# Patient Record
Sex: Female | Born: 1955 | Race: White | Hispanic: No | Marital: Married | Smoking: Never smoker
Health system: Southern US, Community
[De-identification: ages and names within clinical notes are randomized; demographics above are authoritative.]

## PROBLEM LIST (undated history)

## (undated) DIAGNOSIS — E785 Hyperlipidemia, unspecified: Secondary | ICD-10-CM

## (undated) DIAGNOSIS — Z8489 Family history of other specified conditions: Secondary | ICD-10-CM

## (undated) HISTORY — DX: Hyperlipidemia, unspecified: E78.5

## (undated) HISTORY — PX: TUBAL LIGATION: SHX77

## (undated) HISTORY — PX: COLONOSCOPY W/ BIOPSIES: SHX1374

---

## 1993-09-19 HISTORY — PX: ROTATOR CUFF REPAIR: SHX139

## 2001-01-30 ENCOUNTER — Other Ambulatory Visit: Admission: RE | Admit: 2001-01-30 | Discharge: 2001-01-30 | Payer: Self-pay | Admitting: Family Medicine

## 2003-01-20 ENCOUNTER — Other Ambulatory Visit: Admission: RE | Admit: 2003-01-20 | Discharge: 2003-01-20 | Payer: Self-pay | Admitting: Obstetrics & Gynecology

## 2011-09-23 ENCOUNTER — Ambulatory Visit (INDEPENDENT_AMBULATORY_CARE_PROVIDER_SITE_OTHER): Payer: 59 | Admitting: Sports Medicine

## 2011-09-23 VITALS — BP 124/80 | Ht 64.0 in | Wt 205.0 lb

## 2011-09-23 DIAGNOSIS — R269 Unspecified abnormalities of gait and mobility: Secondary | ICD-10-CM

## 2011-09-23 DIAGNOSIS — M7660 Achilles tendinitis, unspecified leg: Secondary | ICD-10-CM | POA: Insufficient documentation

## 2011-09-23 MED ORDER — NITROGLYCERIN 0.2 MG/HR TD PT24: MEDICATED_PATCH | TRANSDERMAL | Status: DC

## 2011-09-23 NOTE — Assessment & Plan Note (Signed)
Will treat with NTG patch, heel lift, and HEP.  We gave her sports insoles with heel lifts and an additional pair of heel lifts to wear in shoes that will not accommodate the sports insoles.

## 2011-09-23 NOTE — Progress Notes (Signed)
  Subjective:    Patient ID: Sabrina Yang, female    DOB: 02/08/1956, 56 y.o.   MRN: [id number]  HPI 56 y/o female is here complaining of right foot pain x6 months.  No trauma.  She was part of a walking group this spring and had no pain at all.  She feels that the pain started when she decreased her activity.  She has pain on the lateral anterior ankle and the achilles tendon.  The achilles tendon pain is worse with uphill walking.  The ankle pain is worse in the am and improved over the day.  She never gets swelling of the ankle   Review of Systems     Objective:   Physical Exam  Ankle: No visible erythema or swelling. Range of motion is full in all directions. Strength is 5/5 in all directions. Stable lateral and medial ligaments Tender to palpation over the sinus tarsi Tender to palpation over the distal achilles tendon Palpable haglund's deformity  Gait: She walks with feet everted, left greater than right.  There is bilateral pronation, left greater than right.  Ultrasound: The right achilles tendon measures 0.77 cm and has a small amount of fluid. There is no doppler activity.  There is a deformity of the calcaneus c/w a haglund's.  The left achilles tendon measures 0.5cm.  There is a small amount of fluid noted in the sinus tarsi.       Assessment & Plan:

## 2011-09-23 NOTE — Assessment & Plan Note (Signed)
Heel lifts in her shoes should decrease the achilles pain and help to her to walk more neutral.  The ankle pain should resolve with correction of her gait.

## 2011-09-23 NOTE — Patient Instructions (Signed)
1.  Do your exercises once a day.  2. Wear your heel in all of your shoes.  3. Wear your patches everyday.  If you get a headache you can use tylenol or ibuprofen. If that doesn't work then call us, [phone].  4. Follow with [place] in 6 weeks.

## 2011-11-07 ENCOUNTER — Ambulatory Visit (INDEPENDENT_AMBULATORY_CARE_PROVIDER_SITE_OTHER): Payer: 59 | Admitting: Sports Medicine

## 2011-11-07 ENCOUNTER — Encounter: Payer: Self-pay | Admitting: Sports Medicine

## 2011-11-07 VITALS — BP 112/74 | HR 60

## 2011-11-07 DIAGNOSIS — M7660 Achilles tendinitis, unspecified leg: Secondary | ICD-10-CM

## 2011-11-07 NOTE — Patient Instructions (Signed)
Your achilles tendonitis has improved, but your achilles is still thicker than normal  Please continue using the 1/4 nitroglycerin patch daily, and continue doing your home exercises for this  Please follow up in 6 weeks.  Thank you for seeing [place] today!

## 2011-11-07 NOTE — Assessment & Plan Note (Signed)
This is much improved  Based on the thickness of her  Achilles tendon, I think we should continue this treatment for at least another 6 weeks  Keep up exercises, nitroglycerin and sports insoles  Recheck 6 weeks

## 2011-11-07 NOTE — Progress Notes (Signed)
  Subjective:    Patient ID: Sabrina Yang, female    DOB: 01-Oct-1955, 56 y.o.   MRN: [id number]  HPI  Pt presents to clinic for of RT achilles tendonitis which she says is 50% improved. Still has pain when she gets up to walk first thing in the mornings, and with increased walking. She has been using NTG patch daily, HEP 4 times per week, and using sports insoles with heel lifts.  She is able to do all of her exercise classes including two zumba classes per week     Review of Systems     Objective:   Physical Exam No acute distress  RT AT: Feels less thick  No pain on squeeze test No ttp calf No pain with plantar or dorsi flexion   MSK ultrasound Right Achilles tendon continues to be thick and 0.77 cm about 2 cm above the calcaneus There is a chronic Haglund type bone spur at the distal insertion but this is nontender The swelling and edema noted on last ultrasound is markedly reduced and seems to be in only about 10% of the tendon Doppler activity is normal      Assessment & Plan:

## 2011-12-13 ENCOUNTER — Ambulatory Visit (INDEPENDENT_AMBULATORY_CARE_PROVIDER_SITE_OTHER): Payer: 59 | Admitting: Sports Medicine

## 2011-12-13 VITALS — BP 118/91

## 2011-12-13 DIAGNOSIS — M7660 Achilles tendinitis, unspecified leg: Secondary | ICD-10-CM

## 2011-12-13 DIAGNOSIS — R269 Unspecified abnormalities of gait and mobility: Secondary | ICD-10-CM

## 2011-12-13 NOTE — Progress Notes (Deleted)
  Subjective:    Patient ID: Sabrina Yang, female    DOB: 1956/01/04, 56 y.o.   MRN: [id number]  HPI  Pt presents to clinic for f/u of peroneal tendinitis which he   Review of Systems     Objective:   Physical Exam        Assessment & Plan:

## 2011-12-13 NOTE — Assessment & Plan Note (Addendum)
RT ankle pain continues to improve with NTG patch, shoe inserts, and heel lift exercises. Continue to use NTG patch for 3 more months. Continue to perform heel lifts and exercises 3 days/week. Return to clinic in 3 months to track progress.  Repeat US on that visit

## 2011-12-13 NOTE — Patient Instructions (Signed)
Increase heel lift exercises to 15 lifts x 3 three days a week. Place inserts in all shoes. Continue with NTG patches for another 3 months. Schedule follow up appointment in 3 months or as needed.

## 2011-12-13 NOTE — Assessment & Plan Note (Signed)
Now walking without limp

## 2011-12-13 NOTE — Progress Notes (Signed)
  Subjective:    Patient ID: Sabrina Yang, female    DOB: 1956/09/16, 56 y.o.   MRN: [id number]  HPI  Patient returns to clinic for follow up RT achilles tendonitis.  States that her ankle pain has improved about 50% since last visit.  R heel pain occurs mostly in the morning and at work when she stands after sitting for a long time.  Pain is mild, described as "soreness."  Patient is able to walk 1 mile/day, take Zumba and body pump classes without any difficulty.  Patient continues to use shoe inserts, Nitro patch, and performs heel lifts and exercises 3 times/week.  Denies any pain in knees or hips.  Now 3 mos into TX with NTG and overall 80% improved  Review of Systems  Per HPI    Objective:   Physical Exam NAD  RT AT is TTP only at insertion Thick in distal 2 cms of tendon  Ankle, right. No visible erythema or swelling. Range of motion is full in all directions. Strength is 5/5 in all directions. Stable lateral and medial ligaments; squeeze test and kleiger test unremarkable; No pain at base of 5th MT; No tenderness over cuboid; No tenderness over N spot or navicular prominence No tenderness on posterior aspects of lateral and medial malleolus No sign of peroneal tendon subluxations; Negative tarsal tunnel tinel's Able to walk 4 steps.  [place] 11/2011: decreased swelling and thickness of RT achilles tendon; Doppler flow revealed good arterial flow and neo-vascularization; neovessels are new; central split of tendon is now resolved!     Assessment & Plan:

## 2012-03-12 ENCOUNTER — Ambulatory Visit: Payer: 59 | Admitting: Sports Medicine

## 2012-03-14 ENCOUNTER — Ambulatory Visit: Payer: 59 | Admitting: Sports Medicine

## 2012-04-04 ENCOUNTER — Ambulatory Visit (INDEPENDENT_AMBULATORY_CARE_PROVIDER_SITE_OTHER): Payer: 59 | Admitting: Sports Medicine

## 2012-04-04 VITALS — BP 121/77

## 2012-04-04 DIAGNOSIS — M7660 Achilles tendinitis, unspecified leg: Secondary | ICD-10-CM

## 2012-04-04 NOTE — Patient Instructions (Addendum)
It has been a pleasure to see you today. Continue using nitroglycerin patch daily for 3 more months and make a follow up appointment at that time.

## 2012-04-04 NOTE — Assessment & Plan Note (Signed)
While her sxs are minimal she is 90% improved by her report --- we still see abnormal doppler flow on scanning.  I think we should treat this with same protocol for another 3 mons.  Do exercises more regularly.  Reck with me in 3 mos with repeat US.

## 2012-04-04 NOTE — Progress Notes (Signed)
  Subjective:    Patient ID: Sabrina Yang, female    DOB: 05/25/1956, 56 y.o.   MRN: [id number]  HPI Pt that comes today for Achilles tendonitis follow up. She is feeling 90 % better, still some intermittent soreness in the posterior aspect of her right heel but nothing compared to what she had experienced in the past. She complaints now of frequent legs cramps only on her right calf at night time. No edema, erythema or any other sign of inflammation. Exercise has helped but now she is doing them inconsistently. She has been on NTG patch (1/4) for about 6 months now with not reported side effects.   Review of Systems Per HPI    Objective:   Physical Exam NAD NWG:NFAO edema around distal insertion area of Achilles tendon, mildly tender to palpation, no erythema. Normal ROM of R foot. Thompson squeeze test negative.   Ultrasound showed close to healed achilles tendon but persistence of neovascularization. The width is now 0.65.  Area of hypeoechoic change has resolved.    Assessment & Plan:

## 2013-11-04 ENCOUNTER — Emergency Department (HOSPITAL_COMMUNITY)
Admission: EM | Admit: 2013-11-04 | Discharge: 2013-11-04 | Disposition: A | Discharge status: Home / Self Care | Payer: 59 | Attending: Emergency Medicine | Admitting: Emergency Medicine

## 2013-11-04 ENCOUNTER — Encounter (HOSPITAL_COMMUNITY): Payer: Self-pay | Admitting: Emergency Medicine

## 2013-11-04 ENCOUNTER — Emergency Department (HOSPITAL_COMMUNITY): Payer: 59

## 2013-11-04 DIAGNOSIS — R911 Solitary pulmonary nodule: Secondary | ICD-10-CM

## 2013-11-04 DIAGNOSIS — Z79899 Other long term (current) drug therapy: Secondary | ICD-10-CM | POA: Insufficient documentation

## 2013-11-04 DIAGNOSIS — Z9889 Other specified postprocedural states: Secondary | ICD-10-CM | POA: Insufficient documentation

## 2013-11-04 DIAGNOSIS — R079 Chest pain, unspecified: Secondary | ICD-10-CM

## 2013-11-04 DIAGNOSIS — K8020 Calculus of gallbladder without cholecystitis without obstruction: Secondary | ICD-10-CM

## 2013-11-04 DIAGNOSIS — R0602 Shortness of breath: Secondary | ICD-10-CM | POA: Insufficient documentation

## 2013-11-04 DIAGNOSIS — Z7982 Long term (current) use of aspirin: Secondary | ICD-10-CM | POA: Insufficient documentation

## 2013-11-04 LAB — TYPE AND SCREEN
ABO/RH(D): A POS
ANTIBODY SCREEN: NEGATIVE

## 2013-11-04 LAB — CBC WITH DIFFERENTIAL/PLATELET
Basophils Absolute: 0.1 10*3/uL (ref 0.0–0.1)
Basophils Relative: 1 % (ref 0–1)
Eosinophils Absolute: 0.1 10*3/uL (ref 0.0–0.7)
Eosinophils Relative: 2 % (ref 0–5)
HEMATOCRIT: 41.1 % (ref 36.0–46.0)
HEMOGLOBIN: 13.6 g/dL (ref 12.0–15.0)
LYMPHS PCT: 17 % (ref 12–46)
Lymphs Abs: 1.5 10*3/uL (ref 0.7–4.0)
MCH: 28 pg (ref 26.0–34.0)
MCHC: 33.1 g/dL (ref 30.0–36.0)
MCV: 84.7 fL (ref 78.0–100.0)
MONO ABS: 0.5 10*3/uL (ref 0.1–1.0)
MONOS PCT: 6 % (ref 3–12)
NEUTROS PCT: 75 % (ref 43–77)
Neutro Abs: 6.7 10*3/uL (ref 1.7–7.7)
Platelets: 246 10*3/uL (ref 150–400)
RBC: 4.85 MIL/uL (ref 3.87–5.11)
RDW: 13.5 % (ref 11.5–15.5)
WBC: 8.8 10*3/uL (ref 4.0–10.5)

## 2013-11-04 LAB — COMPREHENSIVE METABOLIC PANEL
ALT: 23 U/L (ref 0–35)
AST: 26 U/L (ref 0–37)
Albumin: 3.8 g/dL (ref 3.5–5.2)
Alkaline Phosphatase: 64 U/L (ref 39–117)
BUN: 18 mg/dL (ref 6–23)
CALCIUM: 10.4 mg/dL (ref 8.4–10.5)
CO2: 27 mEq/L (ref 19–32)
CREATININE: 0.67 mg/dL (ref 0.50–1.10)
Chloride: 102 mEq/L (ref 96–112)
GFR calc Af Amer: >90 mL/min (ref >90)
GFR calc non Af Amer: >90 mL/min (ref >90)
GLUCOSE: 113 mg/dL — AB (ref 70–99)
Potassium: 4.5 mEq/L (ref 3.7–5.3)
SODIUM: 140 meq/L (ref 137–147)
TOTAL PROTEIN: 7.3 g/dL (ref 6.0–8.3)
Total Bilirubin: 0.3 mg/dL (ref 0.3–1.2)

## 2013-11-04 LAB — URINALYSIS, ROUTINE W REFLEX MICROSCOPIC
Bilirubin Urine: NEGATIVE
Glucose, UA: NEGATIVE mg/dL
HGB URINE DIPSTICK: NEGATIVE
Ketones, ur: NEGATIVE mg/dL
LEUKOCYTES UA: NEGATIVE
Nitrite: NEGATIVE
PH: 7.5 (ref 5.0–8.0)
PROTEIN: NEGATIVE mg/dL
Specific Gravity, Urine: 1.015 (ref 1.005–1.030)
Urobilinogen, UA: 0.2 mg/dL (ref 0.0–1.0)

## 2013-11-04 LAB — POCT I-STAT TROPONIN I
Troponin i, poc: 0 ng/mL (ref 0.00–0.08)
Troponin i, poc: 0 ng/mL (ref 0.00–0.08)

## 2013-11-04 LAB — URINE MICROSCOPIC-ADD ON

## 2013-11-04 LAB — LIPASE, BLOOD: Lipase: 34 U/L (ref 11–59)

## 2013-11-04 LAB — CG4 I-STAT (LACTIC ACID): LACTIC ACID, VENOUS: 1.5 mmol/L (ref 0.5–2.2)

## 2013-11-04 LAB — ABO/RH: ABO/RH(D): A POS

## 2013-11-04 MED ORDER — MORPHINE SULFATE 2 MG/ML IJ SOLN
2.0000 mg | Freq: Once | INTRAMUSCULAR | Status: AC
  Administered 2013-11-04: 2 mg via INTRAVENOUS
  Filled 2013-11-04: qty 1

## 2013-11-04 MED ORDER — ONDANSETRON HCL 4 MG/2ML IJ SOLN
4.0000 mg | Freq: Once | INTRAMUSCULAR | Status: AC
  Administered 2013-11-04: 4 mg via INTRAVENOUS
  Filled 2013-11-04: qty 2

## 2013-11-04 MED ORDER — IOHEXOL 300 MG/ML  SOLN
100.0000 mL | Freq: Once | INTRAMUSCULAR | Status: AC | PRN
  Administered 2013-11-04: 100 mL via INTRAVENOUS

## 2013-11-04 MED ORDER — ONDANSETRON HCL 8 MG PO TABS: 8.0000 mg | ORAL_TABLET | Freq: Three times a day (TID) | ORAL | Status: DC | PRN

## 2013-11-04 MED ORDER — IOHEXOL 300 MG/ML  SOLN
25.0000 mL | INTRAMUSCULAR | Status: AC
  Administered 2013-11-04: 25 mL via ORAL

## 2013-11-04 MED ORDER — OXYCODONE-ACETAMINOPHEN 5-325 MG PO TABS: 1.0000 | ORAL_TABLET | ORAL | Status: DC | PRN

## 2013-11-04 NOTE — ED Notes (Signed)
Pt in xray

## 2013-11-04 NOTE — ED Notes (Signed)
Pt presents from home with c/o of chest pain that is under her Right breast radiating to the right back.  Pt states she was woken up by the pain around 12:30am this morning and the pain has progressively gotten worse.  Pt took 1 Aleve and 3 Tums at home.  Pain is 8/10.  Pt reports a constant nausea from the pain.

## 2013-11-04 NOTE — ED Notes (Signed)
MD at bedside. 

## 2013-11-04 NOTE — ED Provider Notes (Signed)
CSN: [id number]     Arrival date & time 11/04/13  [id number] History   First MD Initiated Contact with Patient 11/04/13 [phone]     Chief Complaint  Patient presents with  . Chest Pain  . Back Pain      Patient is a 58 y.o. female presenting with back pain. The history is provided by the patient.  Back Pain Pain location: SCAPULAR REGION. Quality:  Aching Radiates to: right breast. Pain severity:  Moderate Pain is:  Same all the time Onset quality:  Sudden Duration:  7 hours Timing:  Constant Progression:  Unchanged Chronicity:  New Relieved by:  None tried Worsened by:  Movement Associated symptoms: abdominal pain and chest pain   Associated symptoms: no fever, no headaches and no weakness   Pt reports that while sleeping last night she had onset of upper back pain/scapular pain.  It then radiated to her right breast.  She reports mild SOB.  No fever/cough.  She reports RUQ pain as well.    She denies h/o CAD/DVT/PE No fam h/o CAD  She reports she had an uncomplicated colonoscopy last week  She denies pleuritic type CP She is not on hormone replacement She does not smoke  PMH - none fam hx - negative for CAD Past Surgical History  Procedure Laterality Date  . Colonoscopy w/ biopsies     History reviewed. No pertinent family history. History  Substance Use Topics  . Smoking status: Never Smoker   . Smokeless tobacco: Never Used  . Alcohol Use: No   OB History   Grav Para Term Preterm Abortions TAB SAB Ect Mult Living                 Review of Systems  Constitutional: Negative for fever.  Eyes: Negative for visual disturbance.  Respiratory: Positive for shortness of breath.   Cardiovascular: Positive for chest pain.  Gastrointestinal: Positive for nausea and abdominal pain. Negative for vomiting.  Musculoskeletal: Positive for back pain.  Neurological: Negative for syncope, weakness and headaches.  All other systems reviewed and are negative.      Allergies   Review of patient's allergies indicates no known allergies.  Home Medications   Current Outpatient Rx  Name  Route  Sig  Dispense  Refill  . aspirin 81 MG tablet   Oral   Take 160 mg by mouth daily.           . Multiple Vitamins-Iron (DAILY VITAMINS/IRON PO)   Oral   Take by mouth.           . nitroGLYCERIN (NITRODUR - DOSED IN MG/24 HR) 0.2 mg/hr      Apply 1/4 patch to your heel, change daily.   30 patch   11   . Omega-3 Fatty Acids (FISH OIL BURP-LESS PO)   Oral   Take by mouth.            BP 132/78  Pulse 73  Temp(Src) 98.7 F (37.1 C) (Oral)  Resp 15  Ht 5\' 5"  (1.651 m)  Wt 197 lb (89.359 kg)  BMI 32.78 kg/m2  SpO2 99% Physical Exam CONSTITUTIONAL: Well developed/well nourished HEAD: Normocephalic/atraumatic EYES: EOMI/PERRL ENMT: Mucous membranes moist NECK: supple no meningeal signs SPINE:entire spine nontender, no focal tenderness to upper back/scapular region No bruising/crepitance/stepoffs noted to spine CV: S1/S2 noted, no murmurs/rubs/gallops noted LUNGS: Lungs are clear to auscultation bilaterally, no apparent distress ABDOMEN: soft, mild RUQ tenderness noted, no rebound or guarding GU:no cva tenderness NEURO:  Pt is awake/alert, moves all extremitiesx4 EXTREMITIES: pulses normal, full ROM, no LE edema/tenderness noted SKIN: warm, color normal PSYCH: no abnormalities of mood noted  ED Course  Procedures (including critical care time) 8:14 AM Pt with scapular pain as well as abd pain since last night.  She is s/p colonscopy (2/13) Initial labs/imaging ordered Pt is noted to have low HEART score (less than 3) 10:49 AM Pt now developing worsening abd pain, now with RUQ and RLQ tenderness Will obtain CT imaging of abd/pelvis Pt denies active CP I doubt ACS at this time.  She denies pleuritic pain to suggest PE 12:52 PM CT shows cholelithiasis Labs reassuring She would like to go home as she is improved Advised outpatient surgical  followup We discussed strict return precautions  Labs Review Labs Reviewed  COMPREHENSIVE METABOLIC PANEL - Abnormal; Notable for the following:    Glucose, Bld 113 (*)    All other components within normal limits  URINALYSIS, ROUTINE W REFLEX MICROSCOPIC - Abnormal; Notable for the following:    APPearance TURBID (*)    All other components within normal limits  CBC WITH DIFFERENTIAL  LIPASE, BLOOD  URINE MICROSCOPIC-ADD ON  CG4 I-STAT (LACTIC ACID)  POCT I-STAT TROPONIN I  POCT I-STAT TROPONIN I  TYPE AND SCREEN  ABO/RH   Imaging Review Dg Abd Acute W/chest  11/04/2013   CLINICAL DATA:  Chest and lower abdominal pains  EXAM: ACUTE ABDOMEN SERIES (ABDOMEN 2 VIEW & CHEST 1 VIEW)  COMPARISON:  None.  FINDINGS: There is no evidence of dilated bowel loops or free intraperitoneal air. Moderate volume of formed stool in the rectum and colon. No radiopaque calculi or other significant radiographic abnormality is seen. Heart size and mediastinal contours are within normal limits. No focal airspace consolidation, pneumothorax, pulmonary edema or effusion. Nodular opacities are noted overlying both upper lobes. These are not definitively consistent with EKG leads. Minimal bronchitic changes.  IMPRESSION: 1. No acute cardiopulmonary process. 2. Nodular opacities overlying the upper lungs bilaterally may represent pulmonary nodules or cardiac leads. If the patient has cardiac leads on the upper chest, recommend removal of the adhesive portion and repeat chest x-ray. If there are no leads on the patient, then these are likely true pulmonary nodules and further evaluation with nonemergent chest CT would be warranted. 3. Negative for obstruction or free air. 4. A moderate volume of formed stool in the rectum and colon suggestive of constipation.   Electronically Signed   By: Malachy Moan M.D.   On: 11/04/2013 08:01    EKG Interpretation    Date/Time:  Monday November 04 2013 06:56:14  EST Ventricular Rate:  67 PR Interval:  166 QRS Duration: 93 QT Interval:  384 QTC Calculation: 405 R Axis:   50 Text Interpretation:  Sinus rhythm Non-specific ST-t changes No previous ECGs available Confirmed by Bebe Shaggy  MD, Akayla Brass [phone]) on 11/04/2013 7:07:56 AM            MDM   Final diagnoses:  Chest pain  Cholelithiasis  Pulmonary nodule    Nursing notes including past medical history and social history reviewed and considered in documentation xrays reviewed and considered Labs/vital reviewed and considered     Joya Gaskins, MD 11/04/13 1253

## 2013-11-04 NOTE — Discharge Instructions (Signed)
PLEASE FOLLOWUP WITH YOUR PRIMARY DOCTOR IN ONE MONTH TO RE-EVALUATE FOR PULMONARY NODULES IN YOUR LUNGS

## 2013-11-15 ENCOUNTER — Ambulatory Visit (INDEPENDENT_AMBULATORY_CARE_PROVIDER_SITE_OTHER): Payer: 59 | Admitting: General Surgery

## 2013-11-20 ENCOUNTER — Ambulatory Visit (INDEPENDENT_AMBULATORY_CARE_PROVIDER_SITE_OTHER): Payer: 59 | Admitting: General Surgery

## 2013-11-21 ENCOUNTER — Encounter (INDEPENDENT_AMBULATORY_CARE_PROVIDER_SITE_OTHER): Payer: Self-pay | Admitting: General Surgery

## 2013-11-21 ENCOUNTER — Ambulatory Visit (INDEPENDENT_AMBULATORY_CARE_PROVIDER_SITE_OTHER): Payer: 59 | Admitting: General Surgery

## 2013-11-21 VITALS — BP 120/70 | HR 72 | Temp 99.0°F | Resp 14 | Ht 65.5 in | Wt 198.6 lb

## 2013-11-21 DIAGNOSIS — K8020 Calculus of gallbladder without cholecystitis without obstruction: Secondary | ICD-10-CM

## 2013-11-21 NOTE — Progress Notes (Signed)
Patient ID: Sabrina Yang, female   DOB: 08-19-1956, 58 y.o.   MRN: [id number]  Chief Complaint  Patient presents with  . New Evaluation    eval gallbladder    HPI Sabrina Lovelyhyllis Mari Correia is a 58 y.o. female.  The patient is a 58 year old female who was referred secondary to right upper quadrant/epigastric pain. Patient was seen in the ED in on 11/04/2013 secondary to this pain. Patient underwent workup which revealed gallstones. Patient had her cardiac issues cleared at this visit.    Patient states that thePain in the subset was his initial episode. She states she had some nausea and vomiting associated with it.  HPI  Past Medical History  Diagnosis Date  . Hyperlipidemia     Past Surgical History  Procedure Laterality Date  . Colonoscopy w/ biopsies    . Rotator cuff repair  1995    right side  . Tubal ligation      Family History  Problem Relation Age of Onset  . Cancer Mother     breast  . Diabetes Mother   . Hypertension Mother   . Hypertension Father   . Cancer Father     prostate  . Cancer Sister     lung    Social History History  Substance Use Topics  . Smoking status: Never Smoker   . Smokeless tobacco: Never Used  . Alcohol Use: No    No Known Allergies  Current Outpatient Prescriptions  Medication Sig Dispense Refill  . Multiple Vitamins-Iron (DAILY VITAMINS/IRON PO) Take 1 tablet by mouth daily.       . Omega-3 Fatty Acids (FISH OIL BURP-LESS PO) Take 1 capsule by mouth daily.        No current facility-administered medications for this visit.    Review of Systems Review of Systems  Constitutional: Negative.   HENT: Negative.   Respiratory: Negative.   Cardiovascular: Negative.   Gastrointestinal: Positive for abdominal pain.  Neurological: Negative.   All other systems reviewed and are negative.    Blood pressure 120/70, pulse 72, temperature 99 F (37.2 C), temperature source Oral, resp. rate 14, height 5' 5.5" (1.664 m), weight  198 lb 9.6 oz (90.084 kg).  Physical Exam Physical Exam  Constitutional: She is oriented to person, place, and time. She appears well-developed and well-nourished.  HENT:  Head: Normocephalic and atraumatic.  Eyes: Conjunctivae and EOM are normal. Pupils are equal, round, and reactive to light.  Neck: Normal range of motion. Neck supple.  Cardiovascular: Normal rate, regular rhythm and normal heart sounds.   Pulmonary/Chest: Effort normal and breath sounds normal.  Abdominal: Soft. Bowel sounds are normal. She exhibits no distension and no mass. There is no tenderness. There is no rebound and no guarding.  Musculoskeletal: Normal range of motion.  Neurological: She is alert and oriented to person, place, and time.  Skin: Skin is warm and dry.  Psychiatric: She has a normal mood and affect.    Data Reviewed Laboratory studies within normal limits  CT scan revealed large 1.7 cm stone.  Assessment    58 year old female with systematic cholelithiasis     Plan    1. we'll proceed to the operating room for a laparoscopic cholecystectomy 2. All risks and benefits were discussed with the patient to generally include: infection, bleeding, possible need for post op ERCP, damage to the bile ducts, and bile leak. Alternatives were offered and described.  All questions were answered and the patient voiced understanding of the  procedure and wishes to proceed at this point with a laparoscopic cholecystectomy         Marigene Ehlers., [facility] 11/21/2013, 9:36 AM

## 2013-12-30 ENCOUNTER — Encounter (HOSPITAL_COMMUNITY): Payer: Self-pay

## 2013-12-31 ENCOUNTER — Encounter (HOSPITAL_COMMUNITY): Payer: Self-pay

## 2013-12-31 ENCOUNTER — Other Ambulatory Visit (INDEPENDENT_AMBULATORY_CARE_PROVIDER_SITE_OTHER): Payer: Self-pay | Admitting: General Surgery

## 2013-12-31 ENCOUNTER — Encounter (HOSPITAL_COMMUNITY)
Admission: RE | Admit: 2013-12-31 | Discharge: 2013-12-31 | Disposition: A | Discharge status: Home / Self Care | Payer: 59 | Source: Ambulatory Visit | Attending: General Surgery | Admitting: General Surgery

## 2013-12-31 HISTORY — DX: Family history of other specified conditions: Z84.89

## 2013-12-31 LAB — CBC
HEMATOCRIT: 40.3 % (ref 36.0–46.0)
Hemoglobin: 13.3 g/dL (ref 12.0–15.0)
MCH: 28.1 pg (ref 26.0–34.0)
MCHC: 33 g/dL (ref 30.0–36.0)
MCV: 85 fL (ref 78.0–100.0)
Platelets: 226 10*3/uL (ref 150–400)
RBC: 4.74 MIL/uL (ref 3.87–5.11)
RDW: 13.5 % (ref 11.5–15.5)
WBC: 6.9 10*3/uL (ref 4.0–10.5)

## 2013-12-31 LAB — BASIC METABOLIC PANEL
BUN: 22 mg/dL (ref 6–23)
CHLORIDE: 103 meq/L (ref 96–112)
CO2: 23 meq/L (ref 19–32)
Calcium: 9.8 mg/dL (ref 8.4–10.5)
Creatinine, Ser: 0.66 mg/dL (ref 0.50–1.10)
GFR calc non Af Amer: >90 mL/min (ref >90)
Glucose, Bld: 92 mg/dL (ref 70–99)
Potassium: 4.5 mEq/L (ref 3.7–5.3)
Sodium: 140 mEq/L (ref 137–147)

## 2013-12-31 MED ORDER — CEFAZOLIN SODIUM-DEXTROSE 2-3 GM-% IV SOLR
2.0000 g | INTRAVENOUS | Status: AC
  Administered 2014-01-01: 2 g via INTRAVENOUS
  Filled 2013-12-31: qty 50

## 2013-12-31 MED ORDER — CHLORHEXIDINE GLUCONATE 4 % EX LIQD
1.0000 "application " | Freq: Once | CUTANEOUS | Status: DC
  Filled 2013-12-31: qty 15

## 2013-12-31 NOTE — Progress Notes (Addendum)
No orders at PAT. Will call office around 9am. Patient had echo greater then 10 yrs ago.  Denies seeing a cardiologist, having any heart issues now.   PMH unremarkable. Have called and left message with surgical scheduling of [facility] for orders.

## 2013-12-31 NOTE — Pre-Procedure Instructions (Signed)
Sabrina Yang  12/31/2013   Your procedure is scheduled on:  Wednesday, April 15th   [facility] 3 at 6:30 AM.   Call this number if you have problems the morning of surgery: [phone]   Remember:   Do not eat food or drink liquids after midnight tonight.   Take these medicines the morning of surgery with A SIP OF WATER: Nothing   Do not wear jewelry, make-up or nail polish.  Do not wear lotions, powders, or perfumes. You may NOT wear deodorant.  Do not shave 48 hours prior to surgery.    Do not bring valuables to the hospital.  [facility] is not responsible for any belongings or valuables.               Contacts, dentures or bridgework may not be worn into surgery.  Leave suitcase in the car. After surgery it may be brought to your room.  For patients admitted to the hospital, discharge time is determined by your treatment team.               Patients discharged the day of surgery will not be allowed to drive home, and a responsible adult will need to stay with you for the first 24 hrs after surgery.   Name and phone number of your driver:    Special Instructions: "Preparing for Surgery" instruction sheet.   Please read over the following fact sheets that you were given: Pain Booklet, Coughing and Deep Breathing and Surgical Site Infection Prevention

## 2014-01-01 ENCOUNTER — Encounter (HOSPITAL_COMMUNITY)
Admission: RE | Disposition: A | Discharge status: Home / Self Care | Payer: Self-pay | Source: Ambulatory Visit | Attending: General Surgery

## 2014-01-01 ENCOUNTER — Encounter (HOSPITAL_COMMUNITY): Payer: 59 | Admitting: Certified Registered"

## 2014-01-01 ENCOUNTER — Encounter (HOSPITAL_COMMUNITY): Payer: Self-pay | Admitting: Certified Registered"

## 2014-01-01 ENCOUNTER — Ambulatory Visit (HOSPITAL_COMMUNITY): Payer: 59 | Admitting: Certified Registered"

## 2014-01-01 ENCOUNTER — Ambulatory Visit (HOSPITAL_COMMUNITY)
Admission: RE | Admit: 2014-01-01 | Discharge: 2014-01-01 | Disposition: A | Discharge status: Home / Self Care | Payer: 59 | Source: Ambulatory Visit | Attending: General Surgery | Admitting: General Surgery

## 2014-01-01 ENCOUNTER — Ambulatory Visit (HOSPITAL_COMMUNITY): Payer: 59

## 2014-01-01 DIAGNOSIS — K429 Umbilical hernia without obstruction or gangrene: Secondary | ICD-10-CM | POA: Insufficient documentation

## 2014-01-01 DIAGNOSIS — E785 Hyperlipidemia, unspecified: Secondary | ICD-10-CM | POA: Insufficient documentation

## 2014-01-01 DIAGNOSIS — K8010 Calculus of gallbladder with chronic cholecystitis without obstruction: Secondary | ICD-10-CM

## 2014-01-01 DIAGNOSIS — K8020 Calculus of gallbladder without cholecystitis without obstruction: Secondary | ICD-10-CM

## 2014-01-01 DIAGNOSIS — E669 Obesity, unspecified: Secondary | ICD-10-CM | POA: Insufficient documentation

## 2014-01-01 HISTORY — PX: CHOLECYSTECTOMY: SHX55

## 2014-01-01 LAB — DIFFERENTIAL
BASOS PCT: 1 % (ref 0–1)
Basophils Absolute: 0 10*3/uL (ref 0.0–0.1)
Eosinophils Absolute: 0.2 10*3/uL (ref 0.0–0.7)
Eosinophils Relative: 2 % (ref 0–5)
LYMPHS ABS: 2 10*3/uL (ref 0.7–4.0)
Lymphocytes Relative: 29 % (ref 12–46)
MONO ABS: 0.7 10*3/uL (ref 0.1–1.0)
MONOS PCT: 10 % (ref 3–12)
NEUTROS ABS: 3.9 10*3/uL (ref 1.7–7.7)
NEUTROS PCT: 58 % (ref 43–77)

## 2014-01-01 LAB — HEPATIC FUNCTION PANEL
ALT: 21 U/L (ref 0–35)
AST: 26 U/L (ref 0–37)
Albumin: 3.9 g/dL (ref 3.5–5.2)
Alkaline Phosphatase: 63 U/L (ref 39–117)
BILIRUBIN TOTAL: 0.3 mg/dL (ref 0.3–1.2)
Bilirubin, Direct: <0.2 mg/dL (ref 0.0–0.3)
Total Protein: 7.1 g/dL (ref 6.0–8.3)

## 2014-01-01 SURGERY — LAPAROSCOPIC CHOLECYSTECTOMY WITH INTRAOPERATIVE CHOLANGIOGRAM
Anesthesia: General | Site: Abdomen

## 2014-01-01 MED ORDER — SODIUM CHLORIDE 0.9 % IV SOLN
INTRAVENOUS | Status: DC | PRN
  Administered 2014-01-01: 09:00:00

## 2014-01-01 MED ORDER — HYDROCODONE-ACETAMINOPHEN 5-325 MG PO TABS: 1.0000 | ORAL_TABLET | Freq: Four times a day (QID) | ORAL | Status: DC | PRN

## 2014-01-01 MED ORDER — BUPIVACAINE-EPINEPHRINE 0.5% -1:200000 IJ SOLN
INTRAMUSCULAR | Status: DC | PRN
  Administered 2014-01-01: 11 mL

## 2014-01-01 MED ORDER — FENTANYL CITRATE 0.05 MG/ML IJ SOLN
INTRAMUSCULAR | Status: DC | PRN
  Administered 2014-01-01: 150 ug via INTRAVENOUS

## 2014-01-01 MED ORDER — NEOSTIGMINE METHYLSULFATE 1 MG/ML IJ SOLN
INTRAMUSCULAR | Status: AC
  Filled 2014-01-01: qty 10

## 2014-01-01 MED ORDER — DEXAMETHASONE SODIUM PHOSPHATE 4 MG/ML IJ SOLN
INTRAMUSCULAR | Status: AC
  Filled 2014-01-01: qty 1

## 2014-01-01 MED ORDER — LIDOCAINE HCL (CARDIAC) 20 MG/ML IV SOLN
INTRAVENOUS | Status: DC | PRN
  Administered 2014-01-01: 60 mg via INTRAVENOUS

## 2014-01-01 MED ORDER — PROPOFOL 10 MG/ML IV BOLUS
INTRAVENOUS | Status: DC | PRN
  Administered 2014-01-01: 20 mg via INTRAVENOUS
  Administered 2014-01-01: 140 mg via INTRAVENOUS

## 2014-01-01 MED ORDER — GLYCOPYRROLATE 0.2 MG/ML IJ SOLN
INTRAMUSCULAR | Status: AC
  Filled 2014-01-01: qty 2

## 2014-01-01 MED ORDER — ONDANSETRON HCL 4 MG/2ML IJ SOLN
4.0000 mg | Freq: Once | INTRAMUSCULAR | Status: AC
  Administered 2014-01-01: 4 mg via INTRAVENOUS

## 2014-01-01 MED ORDER — FENTANYL CITRATE 0.05 MG/ML IJ SOLN
25.0000 ug | INTRAMUSCULAR | Status: DC | PRN
  Administered 2014-01-01: 50 ug via INTRAVENOUS
  Administered 2014-01-01: 25 ug via INTRAVENOUS

## 2014-01-01 MED ORDER — GLYCOPYRROLATE 0.2 MG/ML IJ SOLN
INTRAMUSCULAR | Status: DC | PRN
  Administered 2014-01-01: 0.4 mg via INTRAVENOUS

## 2014-01-01 MED ORDER — ACETAMINOPHEN 325 MG PO TABS
325.0000 mg | ORAL_TABLET | ORAL | Status: DC | PRN
Start: 2014-01-01 — End: 2014-01-01

## 2014-01-01 MED ORDER — PROMETHAZINE HCL 25 MG/ML IJ SOLN
6.2500 mg | INTRAMUSCULAR | Status: DC | PRN
  Administered 2014-01-01: 12.5 mg via INTRAVENOUS

## 2014-01-01 MED ORDER — DEXAMETHASONE SODIUM PHOSPHATE 4 MG/ML IJ SOLN
INTRAMUSCULAR | Status: DC | PRN
  Administered 2014-01-01: 4 mg via INTRAVENOUS

## 2014-01-01 MED ORDER — BUPIVACAINE-EPINEPHRINE (PF) 0.25% -1:200000 IJ SOLN
INTRAMUSCULAR | Status: AC
  Filled 2014-01-01: qty 30

## 2014-01-01 MED ORDER — PROMETHAZINE HCL 25 MG/ML IJ SOLN
INTRAMUSCULAR | Status: AC
  Filled 2014-01-01: qty 1

## 2014-01-01 MED ORDER — OXYCODONE HCL 5 MG PO TABS
5.0000 mg | ORAL_TABLET | Freq: Once | ORAL | Status: AC | PRN
  Administered 2014-01-01: 5 mg via ORAL

## 2014-01-01 MED ORDER — FENTANYL CITRATE 0.05 MG/ML IJ SOLN
INTRAMUSCULAR | Status: AC
  Filled 2014-01-01: qty 5

## 2014-01-01 MED ORDER — LACTATED RINGERS IV SOLN
INTRAVENOUS | Status: DC | PRN
  Administered 2014-01-01 (×2): via INTRAVENOUS

## 2014-01-01 MED ORDER — ACETAMINOPHEN 160 MG/5ML PO SOLN
325.0000 mg | ORAL | Status: DC | PRN
  Filled 2014-01-01: qty 20.3

## 2014-01-01 MED ORDER — ONDANSETRON HCL 4 MG/2ML IJ SOLN
INTRAMUSCULAR | Status: DC | PRN
  Administered 2014-01-01: 4 mg via INTRAVENOUS

## 2014-01-01 MED ORDER — OXYCODONE HCL 5 MG/5ML PO SOLN: 5.0000 mg | Freq: Once | ORAL | Status: AC | PRN

## 2014-01-01 MED ORDER — ROCURONIUM BROMIDE 100 MG/10ML IV SOLN
INTRAVENOUS | Status: DC | PRN
  Administered 2014-01-01: 40 mg via INTRAVENOUS
  Administered 2014-01-01: 10 mg via INTRAVENOUS

## 2014-01-01 MED ORDER — KETOROLAC TROMETHAMINE 30 MG/ML IJ SOLN
INTRAMUSCULAR | Status: AC
  Administered 2014-01-01: 30 mg via INTRAVENOUS
  Filled 2014-01-01: qty 1

## 2014-01-01 MED ORDER — ONDANSETRON HCL 4 MG/2ML IJ SOLN
INTRAMUSCULAR | Status: AC
  Filled 2014-01-01: qty 2

## 2014-01-01 MED ORDER — SODIUM CHLORIDE 0.9 % IR SOLN
Status: DC | PRN
  Administered 2014-01-01: 1000 mL

## 2014-01-01 MED ORDER — LIDOCAINE HCL (CARDIAC) 20 MG/ML IV SOLN
INTRAVENOUS | Status: AC
  Filled 2014-01-01: qty 5

## 2014-01-01 MED ORDER — NEOSTIGMINE METHYLSULFATE 1 MG/ML IJ SOLN
INTRAMUSCULAR | Status: DC | PRN
  Administered 2014-01-01: 3 mg via INTRAVENOUS

## 2014-01-01 MED ORDER — FENTANYL CITRATE 0.05 MG/ML IJ SOLN
INTRAMUSCULAR | Status: AC
  Administered 2014-01-01: 50 ug via INTRAVENOUS
  Filled 2014-01-01: qty 2

## 2014-01-01 MED ORDER — OXYCODONE HCL 5 MG PO TABS
ORAL_TABLET | ORAL | Status: AC
  Administered 2014-01-01: 5 mg via ORAL
  Filled 2014-01-01: qty 1

## 2014-01-01 MED ORDER — MIDAZOLAM HCL 5 MG/5ML IJ SOLN
INTRAMUSCULAR | Status: DC | PRN
  Administered 2014-01-01: 2 mg via INTRAVENOUS

## 2014-01-01 MED ORDER — MIDAZOLAM HCL 2 MG/2ML IJ SOLN
INTRAMUSCULAR | Status: AC
  Filled 2014-01-01: qty 2

## 2014-01-01 MED ORDER — ROCURONIUM BROMIDE 50 MG/5ML IV SOLN
INTRAVENOUS | Status: AC
  Filled 2014-01-01: qty 1

## 2014-01-01 MED ORDER — 0.9 % SODIUM CHLORIDE (POUR BTL) OPTIME
TOPICAL | Status: DC | PRN
  Administered 2014-01-01: 1000 mL

## 2014-01-01 MED ORDER — KETOROLAC TROMETHAMINE 30 MG/ML IJ SOLN
15.0000 mg | Freq: Once | INTRAMUSCULAR | Status: AC | PRN
  Administered 2014-01-01: 30 mg via INTRAVENOUS

## 2014-01-01 MED ORDER — PROPOFOL 10 MG/ML IV BOLUS
INTRAVENOUS | Status: AC
  Filled 2014-01-01: qty 20

## 2014-01-01 SURGICAL SUPPLY — 44 items
APPLIER CLIP ROT 10 11.4 M/L (STAPLE) ×3
BLADE SURG ROTATE 9660 (MISCELLANEOUS) IMPLANT
CANISTER SUCTION 2500CC (MISCELLANEOUS) ×3 IMPLANT
CHLORAPREP W/TINT 26ML (MISCELLANEOUS) ×3 IMPLANT
CLIP APPLIE ROT 10 11.4 M/L (STAPLE) ×1 IMPLANT
COVER MAYO STAND STRL (DRAPES) ×3 IMPLANT
COVER SURGICAL LIGHT HANDLE (MISCELLANEOUS) ×3 IMPLANT
DECANTER SPIKE VIAL GLASS SM (MISCELLANEOUS) ×3 IMPLANT
DERMABOND ADVANCED (GAUZE/BANDAGES/DRESSINGS) ×2
DERMABOND ADVANCED .7 DNX12 (GAUZE/BANDAGES/DRESSINGS) ×1 IMPLANT
DRAPE C-ARM 42X72 X-RAY (DRAPES) ×3 IMPLANT
DRAPE UTILITY 15X26 W/TAPE STR (DRAPE) ×6 IMPLANT
ELECT REM PT RETURN 9FT ADLT (ELECTROSURGICAL) ×3
ELECTRODE REM PT RTRN 9FT ADLT (ELECTROSURGICAL) ×1 IMPLANT
GLOVE BIO SURGEON STRL SZ7.5 (GLOVE) ×3 IMPLANT
GLOVE BIOGEL PI IND STRL 6.5 (GLOVE) ×1 IMPLANT
GLOVE BIOGEL PI IND STRL 7.5 (GLOVE) ×1 IMPLANT
GLOVE BIOGEL PI INDICATOR 6.5 (GLOVE) ×2
GLOVE BIOGEL PI INDICATOR 7.5 (GLOVE) ×2
GLOVE ECLIPSE 6.5 STRL STRAW (GLOVE) ×3 IMPLANT
GLOVE ECLIPSE 7.5 STRL STRAW (GLOVE) ×3 IMPLANT
GLOVE EUDERMIC 7 POWDERFREE (GLOVE) ×3 IMPLANT
GOWN STRL REUS W/ TWL LRG LVL3 (GOWN DISPOSABLE) ×3 IMPLANT
GOWN STRL REUS W/ TWL XL LVL3 (GOWN DISPOSABLE) ×1 IMPLANT
GOWN STRL REUS W/TWL LRG LVL3 (GOWN DISPOSABLE) ×6
GOWN STRL REUS W/TWL XL LVL3 (GOWN DISPOSABLE) ×2
KIT BASIN OR (CUSTOM PROCEDURE TRAY) ×3 IMPLANT
KIT ROOM TURNOVER OR (KITS) ×3 IMPLANT
NS IRRIG 1000ML POUR BTL (IV SOLUTION) ×3 IMPLANT
PAD ARMBOARD 7.5X6 YLW CONV (MISCELLANEOUS) ×3 IMPLANT
POUCH SPECIMEN RETRIEVAL 10MM (ENDOMECHANICALS) ×3 IMPLANT
SCISSORS LAP 5X35 DISP (ENDOMECHANICALS) ×3 IMPLANT
SET CHOLANGIOGRAPH 5 50 .035 (SET/KITS/TRAYS/PACK) ×3 IMPLANT
SET IRRIG TUBING LAPAROSCOPIC (IRRIGATION / IRRIGATOR) ×3 IMPLANT
SLEEVE ENDOPATH XCEL 5M (ENDOMECHANICALS) ×3 IMPLANT
SPECIMEN JAR SMALL (MISCELLANEOUS) ×3 IMPLANT
SUT MNCRL AB 4-0 PS2 18 (SUTURE) ×6 IMPLANT
TOWEL OR 17X24 6PK STRL BLUE (TOWEL DISPOSABLE) ×3 IMPLANT
TOWEL OR 17X26 10 PK STRL BLUE (TOWEL DISPOSABLE) ×3 IMPLANT
TOWEL OR NON WOVEN STRL DISP B (DISPOSABLE) ×3 IMPLANT
TRAY LAPAROSCOPIC (CUSTOM PROCEDURE TRAY) ×3 IMPLANT
TROCAR XCEL BLUNT TIP 100MML (ENDOMECHANICALS) ×3 IMPLANT
TROCAR XCEL NON-BLD 11X100MML (ENDOMECHANICALS) ×3 IMPLANT
TROCAR XCEL NON-BLD 5MMX100MML (ENDOMECHANICALS) ×3 IMPLANT

## 2014-01-01 NOTE — H&P (Signed)
Sabrina Yang   MRN:  [id number]   Description: 58 year old female  Provider: Claud KelpHaywood Nikeisha Klutz, MD Department: [facility]        Diagnoses      Gallstones    -  Primary      574.20            Current Vitals Most recent update: 11/21/2013  9:20 AM by Elliot CousinJennifer Witty, CMA      BP Pulse Temp(Src) Resp Ht Wt      120/70 72 99 F (37.2 C) (Oral) 14 5' 5.5" (1.664 m) 198 lb 9.6 oz (90.084 kg)      BMI 32.53 kg/m2                  Vitals History Recorded           History and Physical Claud KelpHaywood Olie Scaffidi, MD   Status: Signed            Patient ID: Sabrina LovelyPhyllis Mari Sauerwein, female   DOB: 03-19-1956, 58 y.o.   MRN: [id number]    Chief Complaint   Patient presents with   .  New Evaluation       eval gallbladder             HPI Sabrina Lovelyhyllis Mari Pemberton is a 58 y.o. female.  The patient is a 58 year old female who was referred secondary to right upper quadrant/epigastric pain. Over the past 2 months or so she has had some mild episodes of intermittent upper abdominal pain.  Patient was seen in the ED in on 11/04/2013 secondary to this pain, Although it was more severe at that time.. Patient underwent workup which revealed gallstones. Patient had her cardiac issues cleared at this visit.     Patient states that the pain in the subset was his initial episode. She states she had some nausea and vomiting associated with it.          Past Medical History   Diagnosis  Date   .  Hyperlipidemia           Past Surgical History   Procedure  Laterality  Date   .  Colonoscopy w/ biopsies       .  Rotator cuff repair    1995       right side   .  Tubal ligation             Family History   Problem  Relation  Age of Onset   .  Cancer  Mother         breast   .  Diabetes  Mother     .  Hypertension  Mother     .  Hypertension  Father     .  Cancer  Father         prostate   .  Cancer  Sister         lung        Social History History   Substance Use Topics    .  Smoking status:  Never Smoker    .  Smokeless tobacco:  Never Used   .  Alcohol Use:  No        No Known Allergies    Current Outpatient Prescriptions   Medication  Sig  Dispense  Refill   .  Multiple Vitamins-Iron (DAILY VITAMINS/IRON PO)  Take 1 tablet by mouth daily.          .  Omega-3 Fatty  Acids (FISH OIL BURP-LESS PO)  Take 1 capsule by mouth daily.               Review of Systems   Constitutional: Negative.   HENT: Negative.   Respiratory: Negative.   Cardiovascular: Negative.   Gastrointestinal: Positive for abdominal pain.  Neurological: Negative.   All other systems reviewed and are negative.       Blood pressure 120/70, pulse 72, temperature 99 F (37.2 C), temperature source Oral, resp. rate 14, height 5' 5.5" (1.664 m), weight 198 lb 9.6 oz (90.084 kg).   Physical Exam  Constitutional: She is oriented to person, place, and time. She appears well-developed and well-nourished.  HENT:   Head: Normocephalic and atraumatic.  Eyes: Conjunctivae and EOM are normal. Pupils are equal, round, and reactive to light.  Neck: Normal range of motion. Neck supple.  Cardiovascular: Normal rate, regular rhythm and normal heart sounds.   Pulmonary/Chest: Effort normal and breath sounds normal.  Abdominal: Soft. Bowel sounds are normal. She exhibits no distension and no mass. There is no tenderness. There is no rebound and no guarding.  Musculoskeletal: Normal range of motion.  Neurological: She is alert and oriented to person, place, and time.  Skin: Skin is warm and dry.  Psychiatric: She has a normal mood and affect.      Data Reviewed Laboratory studies within normal limits   CT scan revealed large 1.7 cm stone.   Assessment     58 year old female with systematic cholelithiasis  Hyperlipidemia Mild obesity     Plan    1. we'll proceed to the operating room for a laparoscopic cholecystectomy 2. All risks and benefits were discussed with the  patient to generally include: infection, bleeding, possible need for post op ERCP, damage to the bile ducts, and bile leak. Alternatives were offered and described.  All questions were answered and the patient voiced understanding of the procedure and wishes to proceed at this point with a laparoscopic cholecystectomy         Angelia MouldHaywood M. Derrell LollingIngram, M.D., [facility] General and Minimally invasive Surgery Breast and Colorectal Surgery Office:   [phone] Pager:   [phone]

## 2014-01-01 NOTE — Discharge Instructions (Signed)
[facility] ______[facility] °LAPAROSCOPIC SURGERY: POST OP INSTRUCTIONS °Always review your discharge instruction sheet given to you by the facility where your surgery was performed. °IF YOU HAVE DISABILITY OR FAMILY LEAVE FORMS, YOU MUST BRING THEM TO THE OFFICE FOR PROCESSING.   °DO NOT GIVE THEM TO YOUR DOCTOR. ° °1. A prescription for pain medication may be given to you upon discharge.  Take your pain medication as prescribed, if needed.  If narcotic pain medicine is not needed, then you may take acetaminophen (Tylenol) or ibuprofen (Advil) as needed. °2. Take your usually prescribed medications unless otherwise directed. °3. If you need a refill on your pain medication, please contact your pharmacy.  They will contact our office to request authorization. Prescriptions will not be filled after 5pm or on week-ends. °4. You should follow a light diet the first few days after arrival home, such as soup and crackers, etc.  Be sure to include lots of fluids daily. °5. Most patients will experience some swelling and bruising in the area of the incisions.  Ice packs will help.  Swelling and bruising can take several days to resolve.  °6. It is common to experience some constipation if taking pain medication after surgery.  Increasing fluid intake and taking a stool softener (such as Colace) will usually help or prevent this problem from occurring.  A mild laxative (Milk of Magnesia or Miralax) should be taken according to package instructions if there are no bowel movements after 48 hours. °7. Unless discharge instructions indicate otherwise, you may remove your bandages 24-48 hours after surgery, and you may shower at that time.  You may have steri-strips (small skin tapes) in place directly over the incision.  These strips should be left on the skin for 7-10 days.  If your surgeon used skin glue on the incision, you may shower in 24 hours.  The glue will flake off over the next 2-3 weeks.  Any sutures or  staples will be removed at the office during your follow-up visit. °8. ACTIVITIES:  You may resume regular (light) daily activities beginning the next day--such as daily self-care, walking, climbing stairs--gradually increasing activities as tolerated.  You may have sexual intercourse when it is comfortable.  Refrain from any heavy lifting or straining until approved by your doctor. °a. You may drive when you are no longer taking prescription pain medication, you can comfortably wear a seatbelt, and you can safely maneuver your car and apply brakes. °b. RETURN TO WORK:  __________________________________________________________ °9. You should see your doctor in the office for a follow-up appointment approximately 2-3 weeks after your surgery.  Make sure that you call for this appointment within a day or two after you arrive home to insure a convenient appointment time. °10. OTHER INSTRUCTIONS: __________________________________________________________________________________________________________________________ __________________________________________________________________________________________________________________________ °WHEN TO CALL YOUR DOCTOR: °1. Fever over 101.0 °2. Inability to urinate °3. Continued bleeding from incision. °4. Increased pain, redness, or drainage from the incision. °5. Increasing abdominal pain ° °The clinic staff is available to answer your questions during regular business hours.  Please don’t hesitate to call and ask to speak to one of the nurses for clinical concerns.  If you have a medical emergency, go to the nearest emergency room or call 911.  A surgeon from [facility] is always on call at the hospital. °[address], [address], [place], [place]  [address] ? [address] [address], [place], [place]   [address] °[phone] ? [phone] ? FAX [phone] °Web site:   [email or web address] °

## 2014-01-01 NOTE — Op Note (Signed)
Patient Name:           Sabrina Yang   Date of Surgery:        01/01/2014  Pre op Diagnosis:      Chronic cholecystitis with cholelithiasis, umbilical hernia  Post op Diagnosis:    Same  Procedure:                 Laparoscopic cholecystectomy with intraoperative cholangiogram, repair of umbilical hernia  Surgeon:                     Angelia MouldHaywood M. Derrell LollingIngram, M.D., FACS  Assistant:                      RNFA  Operative Indications:   Sabrina Yang is a 58 y.o. female. The patient is a 58 year old female who was referred secondary to right upper quadrant/epigastric pain. Over the past 2 months or so she has had some mild episodes of intermittent upper abdominal pain. Patient was seen in the ED in on 11/04/2013 secondary to this pain, Although it was more severe at that time.. Patient underwent workup which revealed gallstones. Liver function tests are normal. CBC is normal.Patient had her cardiac issues cleared at this visit. She was counseled regarding cholecystectomy as an outpatient and is brought to the operating room electively   Operative Findings:       The gallbladder was chronically inflamed. There were extensive adhesions to the infundibulum body and fundus. We were able to see the tip of the fundus at the beginning however. Anatomy of the cystic duct cystic artery and common bile duct were conventional. The intraoperative cholangiogram was normal, showing normal intra-hepatic and extrahepatic biliary anatomy, no filling defects, and no obstruction with good flow of contrast into the duodenum. There were a few adhesions of omentum to the left lateral abdominal wall, more in the left upper quadrant. There were no other abnormalities. The stomach and duodenum looked normal.  Procedure in Detail:          Following the induction of general endotracheal anesthesia the patient's abdomen was prepped and draped in a sterile fashion, intravenous antibiotics were given, and a surgical time out  was performed. 0.5% Marcaine with epinephrine was used as a local infiltration anesthetic. A transverse incision was made below the umbilicus, through a previous scar from her laparoscopic tubal ligation. There was a large area of incarcerated fatty tissue which was slowly debrided back to her umbilical hernia defect which was much smaller. We lifted up the hernia defect and we were able to enter the abdominal cavity under direct vision. An 11 mm Hassan trocar was inserted through the hernia defect and secured with a pursestring suture of 0 Vicryl. Pneumoperitoneum was created. Camera was inserted. An 11 mm trocar was placed subxiphoid region. We looked back with the camera at the umbilical area and there were no other adhesions or evidence of injury. Two 5 mm trocars were placed in the right upper quadrant. The gallbladder fundus was identified and elevated. We slowly took down the adhesions of omentum off the gallbladder. This took about 10 minutes but was uneventful. We could then retract the infundibulum laterally. We incised the peritoneum over the neck of the gallbladder. Dissected out the cystic duct and cystic artery. We dissected these structures until we had a nice window behind both and could see the liver through  this. A cholangiogram catheter was inserted into the cystic duct and  a cholangiogram was obtained using the C-arm. The cholangiogram was normal as described above. The cholangiogram catheter was removed, the cystic duct was secured with multiple metal clips and divided. We could see anterior and posterior branch of the cystic artery. We dissected all of this out and clipped the common branch with multiple clips and then clipped the anterior & osterior branches separately and divided all of these structures. The gallbladder was then dissected from its bed with electrocautery, placed in a specimen bag and removed. The operative field and subphrenic space were irrigated extensively. Irrigation was  fluid was completely clear. There was no bleeding or bile leak from the gallbladder bed or cystic duct that we could see. The trocars were removed and the pneumoperitoneum was released. The fascia at the umbilicus and the hernia was repaired with 0 Vicryl sutures. After irrigating out the incisions the skin was closed with subcuticular sutures of 4-0 Monocryl and Dermabond. The patient tolerated the procedure well was taken to PACU in stable condition. EBL 10 cc. Counts correct. Complications none.     Angelia MouldHaywood M. Derrell LollingIngram, M.D., FACS General and Minimally Invasive Surgery Breast and Colorectal Surgery  01/01/2014 9:40 AM

## 2014-01-01 NOTE — OR Nursing (Signed)
Xray called @0903  for intraoperative cholangiogram

## 2014-01-01 NOTE — Transfer of Care (Signed)
Immediate Anesthesia Transfer of Care Note  Patient: Sabrina Yang  Procedure(s) Performed: Procedure(s): LAPAROSCOPIC CHOLECYSTECTOMY WITH INTRAOPERATIVE CHOLANGIOGRAM (N/A)  Patient Location: PACU  Anesthesia Type:General  Level of Consciousness: awake  Airway & Oxygen Therapy: Patient Spontanous Breathing and Patient connected to nasal cannula oxygen  Post-op Assessment: Report given to PACU RN, Post -op Vital signs reviewed and stable and Patient moving all extremities  Post vital signs: Reviewed and stable  Complications: No apparent anesthesia complications

## 2014-01-01 NOTE — Anesthesia Postprocedure Evaluation (Signed)
  Anesthesia Post-op Note  Patient: Sabrina Yang  Procedure(s) Performed: Procedure(s): LAPAROSCOPIC CHOLECYSTECTOMY WITH INTRAOPERATIVE CHOLANGIOGRAM (N/A)  Patient Location: PACU  Anesthesia Type:General  Level of Consciousness: awake, alert  and oriented  Airway and Oxygen Therapy: Patient Spontanous Breathing  Post-op Pain: mild  Post-op Assessment: Post-op Vital signs reviewed, Patient's Cardiovascular Status Stable, Respiratory Function Stable, Patent Airway, No signs of Nausea or vomiting and Pain level controlled  Post-op Vital Signs: Reviewed and stable  Last Vitals:  Filed Vitals:   01/01/14 1145  BP: 117/69  Pulse: 63  Temp: 36.7 C  Resp: 10    Complications: No apparent anesthesia complications

## 2014-01-01 NOTE — Anesthesia Procedure Notes (Addendum)
Procedure Name: Intubation Date/Time: 01/01/2014 8:36 AM Performed by: Charm BargesBUTLER, Derriona Branscom R Pre-anesthesia Checklist: Patient identified, Emergency Drugs available, Suction available, Patient being monitored and Timeout performed Patient Re-evaluated:Patient Re-evaluated prior to inductionOxygen Delivery Method: Circle system utilized Preoxygenation: Pre-oxygenation with 100% oxygen Intubation Type: IV induction Ventilation: Mask ventilation without difficulty Laryngoscope Size: Mac and 3 Grade View: Grade II Tube type: Oral Tube size: 8.0 mm Number of attempts: 1 Airway Equipment and Method: Stylet Placement Confirmation: ETT inserted through vocal cords under direct vision,  positive ETCO2 and breath sounds checked- equal and bilateral Secured at: 22 cm Tube secured with: Tape Dental Injury: Teeth and Oropharynx as per pre-operative assessment

## 2014-01-01 NOTE — Anesthesia Preprocedure Evaluation (Addendum)
Anesthesia Evaluation  Patient identified by MRN, date of birth, ID band Patient awake    Reviewed: Allergy & Precautions, H&P , NPO status , Patient's Chart, lab work & pertinent test results  History of Anesthesia Complications Negative for: history of anesthetic complications  Airway Mallampati: II TM Distance: >3 FB Neck ROM: Full    Dental  (+) Teeth Intact, Dental Advisory Given   Pulmonary neg pulmonary ROS,  breath sounds clear to auscultation        Cardiovascular - anginanegative cardio ROS  Rhythm:Regular Rate:Normal     Neuro/Psych negative neurological ROS     GI/Hepatic Neg liver ROS, Gall stones,    Endo/Other  negative endocrine ROS  Renal/GU negative Renal ROS     Musculoskeletal negative musculoskeletal ROS (+)   Abdominal   Peds  Hematology negative hematology ROS (+)   Anesthesia Other Findings   Reproductive/Obstetrics                          Anesthesia Physical Anesthesia Plan  ASA: I  Anesthesia Plan: General   Post-op Pain Management:    Induction: Intravenous  Airway Management Planned: Oral ETT  Additional Equipment: None  Intra-op Plan:   Post-operative Plan: Extubation in OR  Informed Consent: I have reviewed the patients History and Physical, chart, labs and discussed the procedure including the risks, benefits and alternatives for the proposed anesthesia with the patient or authorized representative who has indicated his/her understanding and acceptance.   Dental advisory given  Plan Discussed with: CRNA, Surgeon and Anesthesiologist  Anesthesia Plan Comments:        Anesthesia Quick Evaluation

## 2014-01-01 NOTE — Interval H&P Note (Signed)
History and Physical Interval Note:  01/01/2014 8:03 AM  Sabrina Yang  has presented today for surgery, with the diagnosis of gallstones  The various methods of treatment have been discussed with the patient and family. After consideration of risks, benefits and other options for treatment, the patient has consented to  Procedure(s): LAPAROSCOPIC CHOLECYSTECTOMY WITH INTRAOPERATIVE CHOLANGIOGRAM (N/A) as a surgical intervention .  The patient's history has been reviewed, patient examined, no change in status, stable for surgery.  I have reviewed the patient's chart and labs.  Questions were answered to the patient's satisfaction.    This patient was originally scheduled for surgery with Dr. Axel FillerArmando Ramirez. Dr. Derrell Lollingamirez has had to leave town for a family matter. The patient was offered to reschedule surgery with Dr. Derrell Lollingamirez, and she stated that she did not wish to waitwould like to go ahead with surgery and was willing to transfer her care to another surgeon in our group. I have offered to assume her care and have discussed this with her and her husband and they agree. I have examined her this morning and interviewed her. I have discussed indications, details, techniques, and numerous risk of gallbladder surgery with them. They're aware of the risk of bleeding, infection, bile leak, conversion to open laparotomy, wound hernia, injury to adjacent organs, and other unforeseen problems. She understands these issues well and all of her questions are answered. She agrees with this plan.   Sabrina Yang, M.D., [facility] General and Minimally invasive Surgery Breast and Colorectal Surgery Office:   [phone] Pager:   [phone] 8:05 AM

## 2014-01-01 NOTE — Addendum Note (Signed)
Addendum created 01/01/14 1857 by Shireen Quanavid R Shamila Lerch, CRNA   Modules edited: Anesthesia Events

## 2014-01-02 ENCOUNTER — Encounter (HOSPITAL_COMMUNITY): Payer: Self-pay | Admitting: General Surgery

## 2014-02-07 ENCOUNTER — Ambulatory Visit (INDEPENDENT_AMBULATORY_CARE_PROVIDER_SITE_OTHER): Payer: 59 | Admitting: General Surgery

## 2014-02-07 ENCOUNTER — Encounter (INDEPENDENT_AMBULATORY_CARE_PROVIDER_SITE_OTHER): Payer: Self-pay | Admitting: General Surgery

## 2014-02-07 VITALS — BP 124/80 | HR 74 | Temp 97.1°F | Resp 16 | Ht 65.5 in | Wt 200.0 lb

## 2014-02-07 DIAGNOSIS — K8020 Calculus of gallbladder without cholecystitis without obstruction: Secondary | ICD-10-CM

## 2014-02-07 NOTE — Patient Instructions (Addendum)
You are recovering from your gallbladder surgery without any obvious surgical complications.  Continue regular exercise and normal activities. No restrictions  Low-fat diet is advised.  Return to see Dr. Derrell Lolling if necessary.

## 2014-02-07 NOTE — Progress Notes (Signed)
Patient ID: Sabrina Yang, female   DOB: 1956-04-01, 58 y.o.   MRN: [id number] History: This patient underwent laparoscopic cholecystectomy with cholangiogram and repair of small umbilical hernia on 01/01/2014. The pathology report shows chronic cholecystitis with cholelithiasis. She feels fine. A little sore the umbilicus but not bad. Tolerating diet. Normal bowel movements. Was to go back to the gym  Exam:  Patient is alert. No distress Abdomen soft. Nontender. All trocar sites well-healed  Assessment: Chronic cholecystitis with cholelithiasis, uneventful recovery following laparoscopic cholecystectomy and cholangiogram  Plan: Low fat diet.  Exercise regularly. No restrictiion on activities  return as needed.      Angelia Mould. Derrell Lolling, M.D., [facility] General and Minimally invasive Surgery Breast and Colorectal Surgery Office:   [phone] Pager:   [phone]

## 2016-10-12 ENCOUNTER — Ambulatory Visit
Admission: RE | Admit: 2016-10-12 | Discharge: 2016-10-12 | Disposition: A | Discharge status: Home / Self Care | Payer: 59 | Source: Ambulatory Visit | Attending: Sports Medicine | Admitting: Sports Medicine

## 2016-10-12 ENCOUNTER — Ambulatory Visit (INDEPENDENT_AMBULATORY_CARE_PROVIDER_SITE_OTHER): Payer: 59 | Admitting: Family Medicine

## 2016-10-12 ENCOUNTER — Encounter: Payer: Self-pay | Admitting: Family Medicine

## 2016-10-12 VITALS — BP 121/71 | Ht 65.0 in | Wt 220.0 lb

## 2016-10-12 DIAGNOSIS — M79672 Pain in left foot: Secondary | ICD-10-CM | POA: Diagnosis not present

## 2016-10-12 DIAGNOSIS — M7732 Calcaneal spur, left foot: Secondary | ICD-10-CM | POA: Diagnosis not present

## 2016-10-12 NOTE — Progress Notes (Signed)
  Sabrina Yang - 61 y.o. female MRN [id number]  Date of birth: 1956-03-28  SUBJECTIVE:  Including CC & ROS.   [name] is a 61 yo F that is presenting with left medial heel pain. The pain started about two months ago. 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 She is having trouble walking. The pain is sharp. Denies any inciting event. It's worse after she has done walking.   ROS: No unexpected weight loss, fever, chills, swelling, instability, muscle pain, numbness/tingling, redness, otherwise see HPI    HISTORY: Past Medical, Surgical, Social, and Family History Reviewed & Updated per EMR.   Pertinent Historical Findings include: PMSHx -  Right RTC repair, hammer toe surgery  PSHx -  No tobacco or alcohol use  FHx -  none Medications - none  DATA REVIEWED: None   PHYSICAL EXAM:  VS: BP:121/71  HR: bpm  TEMP: ( )  RESP:   HT:5\' 5"  (165.1 cm)   WT:220 lb (99.8 kg)  BMI:36.7 PHYSICAL EXAM: Gen: NAD, alert, cooperative with exam, well-appearing HEENT: clear conjunctiva, EOMI CV:  no edema, capillary refill brisk,  Resp: non-labored, normal speech Skin: no rashes, normal turgor  Neuro: no gross deficits.  Psych:  alert and oriented Left foot:  TTP of the medial calcaneous  No obvious swelling or ecchymosis  No TTP of the achilles or its insertion  Normal ROM  Normal strength to resistance  Negative anterior drawer  Neurovasculary intact   Limited US: Left foot: the plantar fascia appears to be mildly thickened at 0.7 mm. There doesn't appear to be a large calcaneal spur. The achilles and its insertion appear to be normal   Summary: findings are suggestive of plantar fasciitis.   ASSESSMENT & PLAN:   Left foot pain Likely this is plantar fasciitis but she could have a calcaneal spur.  - left foot x-ray  - HEP provided today  - if no improvement then consider formal PT vs  injection

## 2016-10-16 DIAGNOSIS — M79672 Pain in left foot: Secondary | ICD-10-CM | POA: Insufficient documentation

## 2016-10-16 NOTE — Assessment & Plan Note (Signed)
Likely this is plantar fasciitis but she could have a calcaneal spur.  - left foot x-ray  - HEP provided today  - if no improvement then consider formal PT vs injection

## 2016-10-17 ENCOUNTER — Telehealth: Payer: Self-pay | Admitting: Family Medicine

## 2016-10-17 NOTE — Telephone Encounter (Signed)
Spoke with patient about her x-ray results. She will continue her HEP.   Myra RudeJeremy E Nyilah Kight, MD PGY-4, [facility] 10/17/2016, 9:26 AM

## 2016-11-09 ENCOUNTER — Ambulatory Visit (INDEPENDENT_AMBULATORY_CARE_PROVIDER_SITE_OTHER): Payer: 59 | Admitting: Family Medicine

## 2016-11-09 ENCOUNTER — Encounter: Payer: Self-pay | Admitting: Family Medicine

## 2016-11-09 DIAGNOSIS — M79672 Pain in left foot: Secondary | ICD-10-CM | POA: Diagnosis not present

## 2016-11-14 NOTE — Progress Notes (Signed)
  Sabrina Yang - 61 y.o. female MRN [id number]  Date of birth: September 19, 1956  SUBJECTIVE:  Including CC & ROS.   Sabrina Yang is a 61 yo F that is following up for left heel pain thought to be plantar fasciitis. She reports she's had about 30% improvement of her pain.  She has been using the heel cups as well as the midfoot arch strap and has noticed a decrease of her pain.  She is also been doing the home exercises.  ROS: No unexpected weight loss, fever, chills, swelling, instability, muscle pain, numbness/tingling, redness, otherwise see HPI   HISTORY: Past Medical, Surgical, Social, and Family History Reviewed & Updated per EMR.   Pertinent Historical Findings include: PMSHx -  Right RTC repair    DATA REVIEWED: None to review   PHYSICAL EXAM:  VS: BP:122/71  HR: bpm  TEMP: ( )  RESP:   HT:5\' 5"  (165.1 cm)   WT:220 lb (99.8 kg)  BMI:36.7 PHYSICAL EXAM: Gen: NAD, alert, cooperative with exam, well-appearing HEENT: clear conjunctiva, EOMI CV:  no edema, capillary refill brisk,  Resp: non-labored, normal speech Skin: no rashes, normal turgor  Neuro: no gross deficits.  Psych:  alert and oriented Left Foot:  Some tenderness to palpation of the medial calcaneus. No obvious swelling. No tenderness palpation of the Achilles or insertion. Normal ankle range of motion. Neurovascularly intact  ASSESSMENT & PLAN:   Left foot pain She has had some improvement with her pain with conservative therapies today. We will continue to try these at this time. - she will f/u in 4 weeks. If no improvement can consider an injection vs referral to PT.

## 2016-11-14 NOTE — Assessment & Plan Note (Signed)
She has had some improvement with her pain with conservative therapies today. We will continue to try these at this time. - she will f/u in 4 weeks. If no improvement can consider an injection vs referral to PT.

## 2016-12-02 DIAGNOSIS — M8580 Other specified disorders of bone density and structure, unspecified site: Secondary | ICD-10-CM | POA: Diagnosis not present

## 2016-12-02 DIAGNOSIS — Z0000 Encounter for general adult medical examination without abnormal findings: Secondary | ICD-10-CM | POA: Diagnosis not present

## 2016-12-02 DIAGNOSIS — E782 Mixed hyperlipidemia: Secondary | ICD-10-CM | POA: Diagnosis not present

## 2016-12-07 ENCOUNTER — Encounter: Payer: Self-pay | Admitting: Family Medicine

## 2016-12-07 ENCOUNTER — Ambulatory Visit (INDEPENDENT_AMBULATORY_CARE_PROVIDER_SITE_OTHER): Payer: 59 | Admitting: Family Medicine

## 2016-12-07 DIAGNOSIS — M79672 Pain in left foot: Secondary | ICD-10-CM | POA: Diagnosis not present

## 2016-12-08 NOTE — Assessment & Plan Note (Signed)
She continues to improve with conservative therapies. She would likely have benefit from the use of custom orthotics. I told her to follow-up for these to be made.

## 2016-12-08 NOTE — Progress Notes (Signed)
  Sabrina Yang - 61 y.o. female MRN [id number]  Date of birth: 07-29-1956  SUBJECTIVE:  Including CC & ROS.   Sabrina Yang is a 61 year old female that is following up for left plantar fasciitis. She has been doing the stretches as well as icing and has noticed significant improvement of her pain. She reports that she is working several long hours but her pain is still improving. She would like to hold off on any injection therapy or any physical therapy.  ROS: No unexpected weight loss, fever, chills, swelling, instability, muscle pain, numbness/tingling, redness, otherwise see HPI    HISTORY: Past Medical, Surgical, Social, and Family History Reviewed & Updated per EMR.   Pertinent Historical Findings include: PMSHx -  Right RTC repair   PHYSICAL EXAM:  VS: BP:116/75  HR:68bpm  TEMP: ( )  RESP:   HT:5\' 5"  (165.1 cm)   WT:220 lb (99.8 kg)  BMI:36.7 PHYSICAL EXAM: Gen: NAD, alert, cooperative with exam, well-appearing HEENT: clear conjunctiva, EOMI CV:  no edema, capillary refill brisk,  Resp: non-labored, normal speech Skin: no rashes, normal turgor  Neuro: no gross deficits.  Psych:  alert and oriented Left foot: No overlying ecchymosis or swelling. No significant tenderness to palpation over the calcaneus. No tenderness to palpation over the Achilles. No significant tender to palpation over the midfoot or forefoot on the plantar aspect. Normal ankle range of motion. Normal strength resistance. 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 Some pronation with her gait. 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 No abnormal callus formation on her plantar aspect. Neurovascularly intact.   ASSESSMENT & PLAN:   Left foot pain She continues to improve with conservative therapies. She would likely have benefit from the use of custom orthotics. I told her to follow-up for these to be  made.

## 2017-06-13 DIAGNOSIS — E7800 Pure hypercholesterolemia, unspecified: Secondary | ICD-10-CM | POA: Diagnosis not present

## 2017-06-13 DIAGNOSIS — R7301 Impaired fasting glucose: Secondary | ICD-10-CM | POA: Diagnosis not present

## 2017-07-07 DIAGNOSIS — Z1231 Encounter for screening mammogram for malignant neoplasm of breast: Secondary | ICD-10-CM | POA: Diagnosis not present

## 2017-07-07 DIAGNOSIS — Z803 Family history of malignant neoplasm of breast: Secondary | ICD-10-CM | POA: Diagnosis not present

## 2017-07-10 DIAGNOSIS — Z01419 Encounter for gynecological examination (general) (routine) without abnormal findings: Secondary | ICD-10-CM | POA: Diagnosis not present

## 2017-09-15 DIAGNOSIS — E7800 Pure hypercholesterolemia, unspecified: Secondary | ICD-10-CM | POA: Diagnosis not present

## 2017-09-15 DIAGNOSIS — R7301 Impaired fasting glucose: Secondary | ICD-10-CM | POA: Diagnosis not present

## 2017-12-14 ENCOUNTER — Other Ambulatory Visit: Payer: Self-pay | Admitting: Physician Assistant

## 2017-12-14 ENCOUNTER — Ambulatory Visit
Admission: RE | Admit: 2017-12-14 | Discharge: 2017-12-14 | Disposition: A | Discharge status: Home / Self Care | Payer: 59 | Source: Ambulatory Visit | Attending: Physician Assistant | Admitting: Physician Assistant

## 2017-12-14 DIAGNOSIS — R05 Cough: Secondary | ICD-10-CM

## 2017-12-14 DIAGNOSIS — R059 Cough, unspecified: Secondary | ICD-10-CM

## 2017-12-22 DIAGNOSIS — R7309 Other abnormal glucose: Secondary | ICD-10-CM | POA: Diagnosis not present

## 2017-12-22 DIAGNOSIS — Z0000 Encounter for general adult medical examination without abnormal findings: Secondary | ICD-10-CM | POA: Diagnosis not present

## 2017-12-22 DIAGNOSIS — E782 Mixed hyperlipidemia: Secondary | ICD-10-CM | POA: Diagnosis not present

## 2018-01-15 ENCOUNTER — Ambulatory Visit
Admission: RE | Admit: 2018-01-15 | Discharge: 2018-01-15 | Disposition: A | Discharge status: Home / Self Care | Payer: 59 | Source: Ambulatory Visit | Attending: Family Medicine | Admitting: Family Medicine

## 2018-01-15 ENCOUNTER — Other Ambulatory Visit: Payer: Self-pay | Admitting: Family Medicine

## 2018-01-15 DIAGNOSIS — R05 Cough: Secondary | ICD-10-CM

## 2018-01-15 DIAGNOSIS — R059 Cough, unspecified: Secondary | ICD-10-CM

## 2018-07-04 DIAGNOSIS — E782 Mixed hyperlipidemia: Secondary | ICD-10-CM | POA: Diagnosis not present

## 2018-07-04 DIAGNOSIS — Z6841 Body Mass Index (BMI) 40.0 and over, adult: Secondary | ICD-10-CM | POA: Diagnosis not present

## 2018-07-04 DIAGNOSIS — R7309 Other abnormal glucose: Secondary | ICD-10-CM | POA: Diagnosis not present

## 2018-07-18 DIAGNOSIS — Z1231 Encounter for screening mammogram for malignant neoplasm of breast: Secondary | ICD-10-CM | POA: Diagnosis not present

## 2018-07-18 DIAGNOSIS — Z803 Family history of malignant neoplasm of breast: Secondary | ICD-10-CM | POA: Diagnosis not present

## 2018-07-31 DIAGNOSIS — Z6839 Body mass index (BMI) 39.0-39.9, adult: Secondary | ICD-10-CM | POA: Diagnosis not present

## 2018-07-31 DIAGNOSIS — Z01419 Encounter for gynecological examination (general) (routine) without abnormal findings: Secondary | ICD-10-CM | POA: Diagnosis not present

## 2018-09-04 DIAGNOSIS — R05 Cough: Secondary | ICD-10-CM | POA: Diagnosis not present

## 2018-09-04 DIAGNOSIS — J069 Acute upper respiratory infection, unspecified: Secondary | ICD-10-CM | POA: Diagnosis not present

## 2018-09-06 DIAGNOSIS — Z780 Asymptomatic menopausal state: Secondary | ICD-10-CM | POA: Diagnosis not present

## 2018-09-06 DIAGNOSIS — E349 Endocrine disorder, unspecified: Secondary | ICD-10-CM | POA: Diagnosis not present

## 2018-12-03 IMAGING — DX DG CHEST 2V
2 series · 2 of 2 positions shown · non-contrast
Comparison: Radiographs November 04, 2013.

CLINICAL DATA: Dyspnea on exertion, cough.

EXAM:
CHEST - 2 VIEW

[dg chest 2 view (1 of 2)]
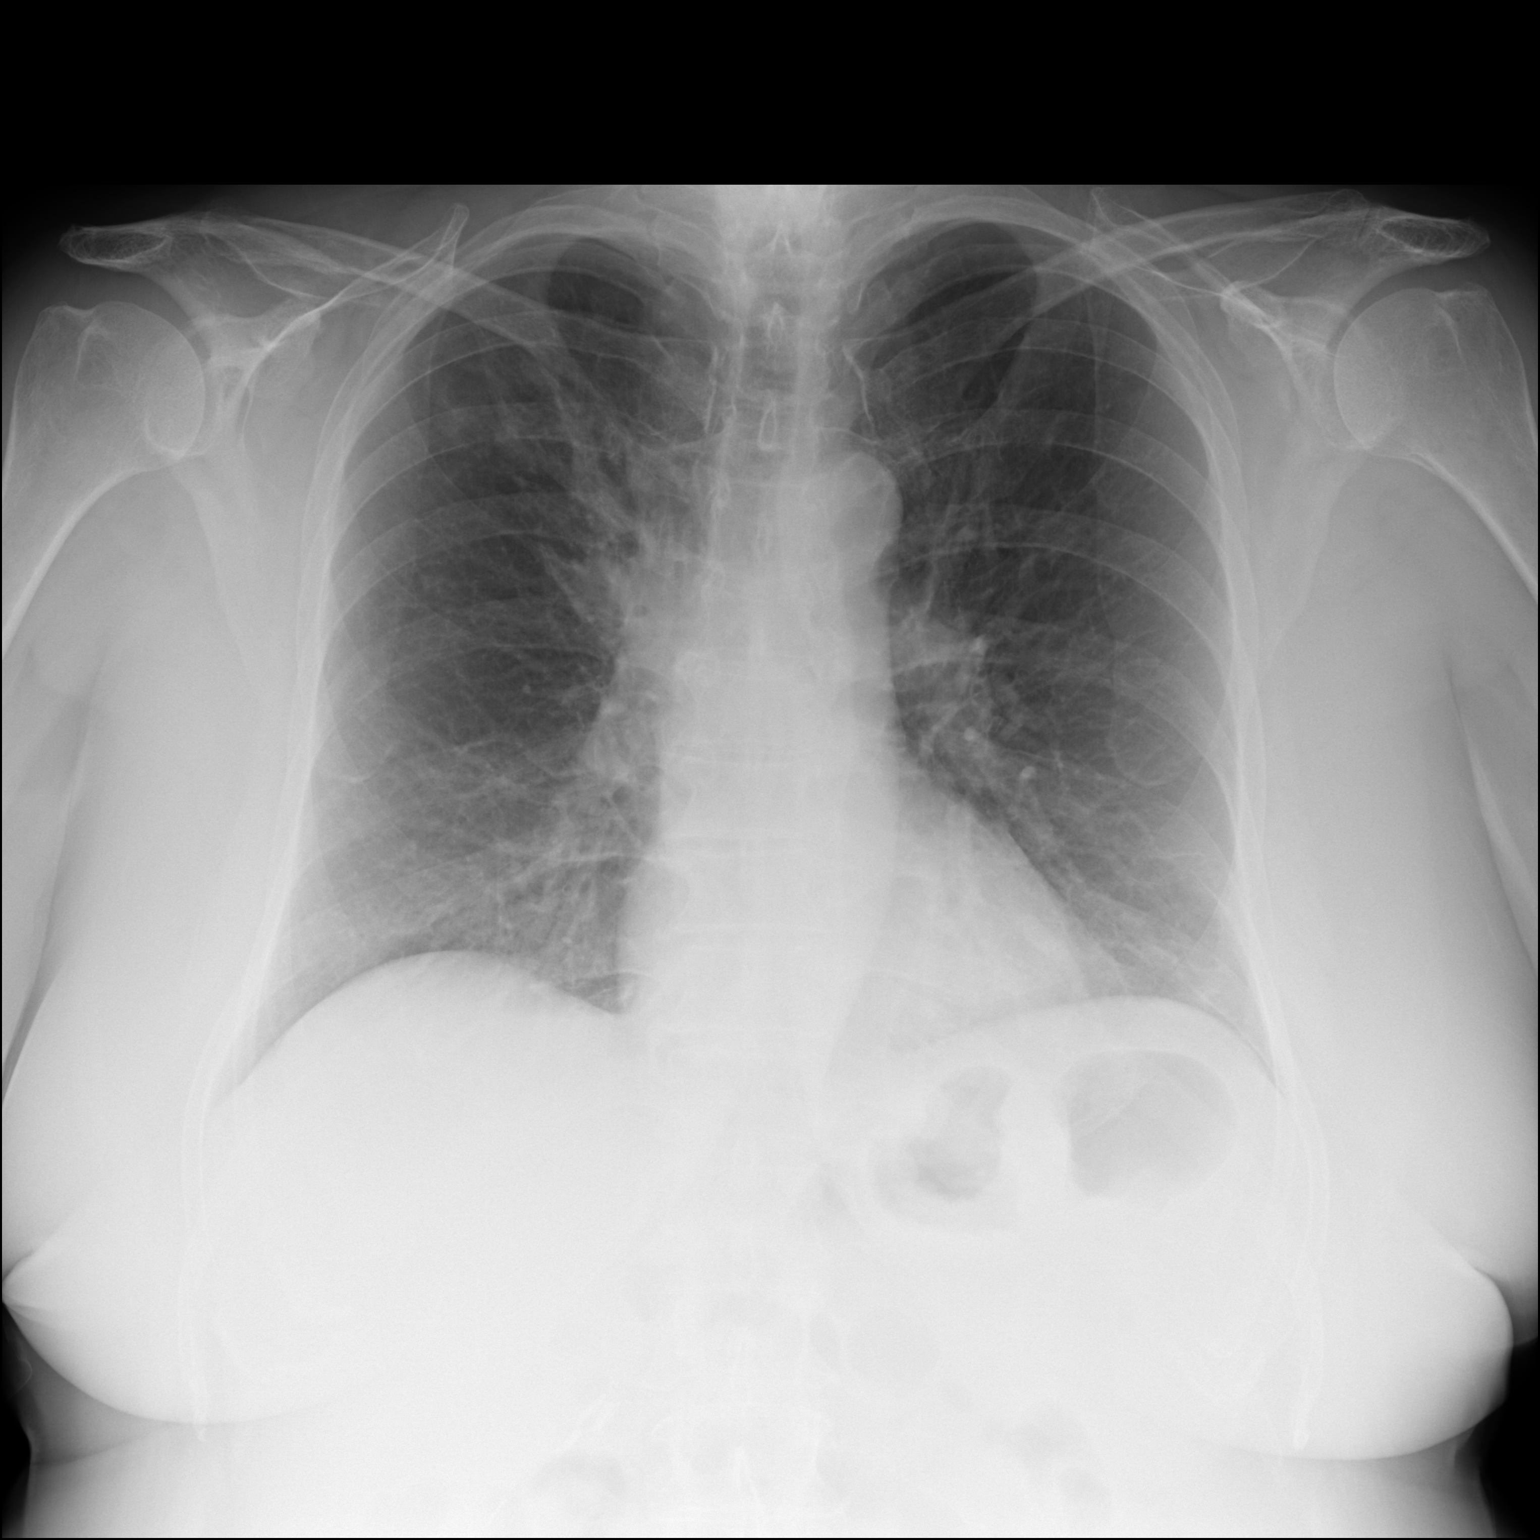

[dg chest 2 view (2 of 2)]
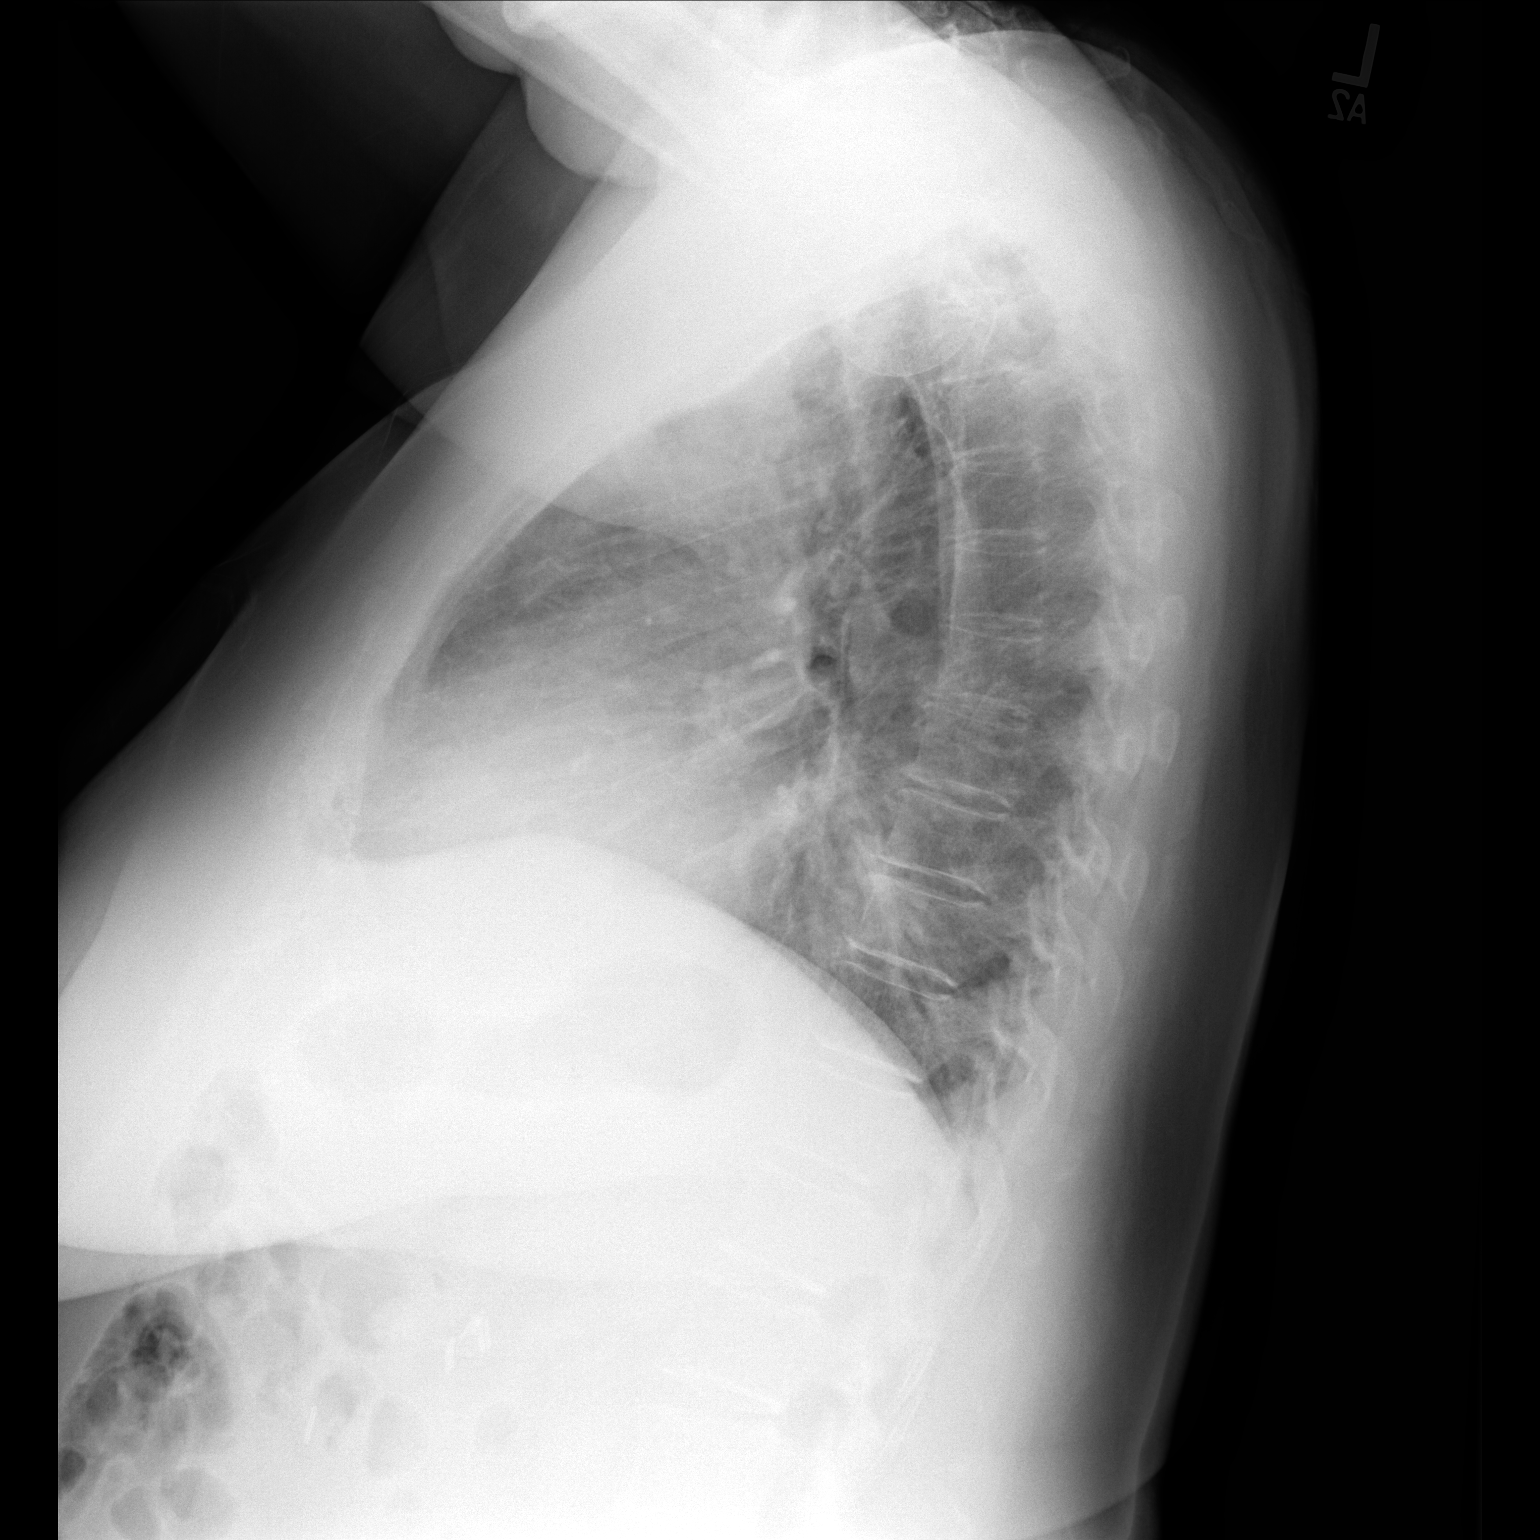

[2 of 2 positions shown; findings below may reference images not displayed]

FINDINGS: The heart size and mediastinal contours are within normal limits. No
pneumothorax or pleural effusion is noted. Mild right suprahilar
opacity is noted concerning for atelectasis or possibly infiltrate.
Left lung is clear. The visualized skeletal structures are
unremarkable.
IMPRESSION: Mild right suprahilar opacity is noted concerning for atelectasis or
possibly infiltrate. Followup PA and lateral chest X-ray is
recommended in 3-4 weeks following trial of antibiotic therapy to
ensure resolution and exclude underlying malignancy.

## 2019-01-04 IMAGING — DX DG CHEST 2V
2 series · 2 of 2 positions shown · non-contrast
Comparison: 12/14/2017 and 11/04/2013

CLINICAL DATA: Cough.

EXAM:
CHEST - 2 VIEW

[dg chest 2 view (1 of 2)]
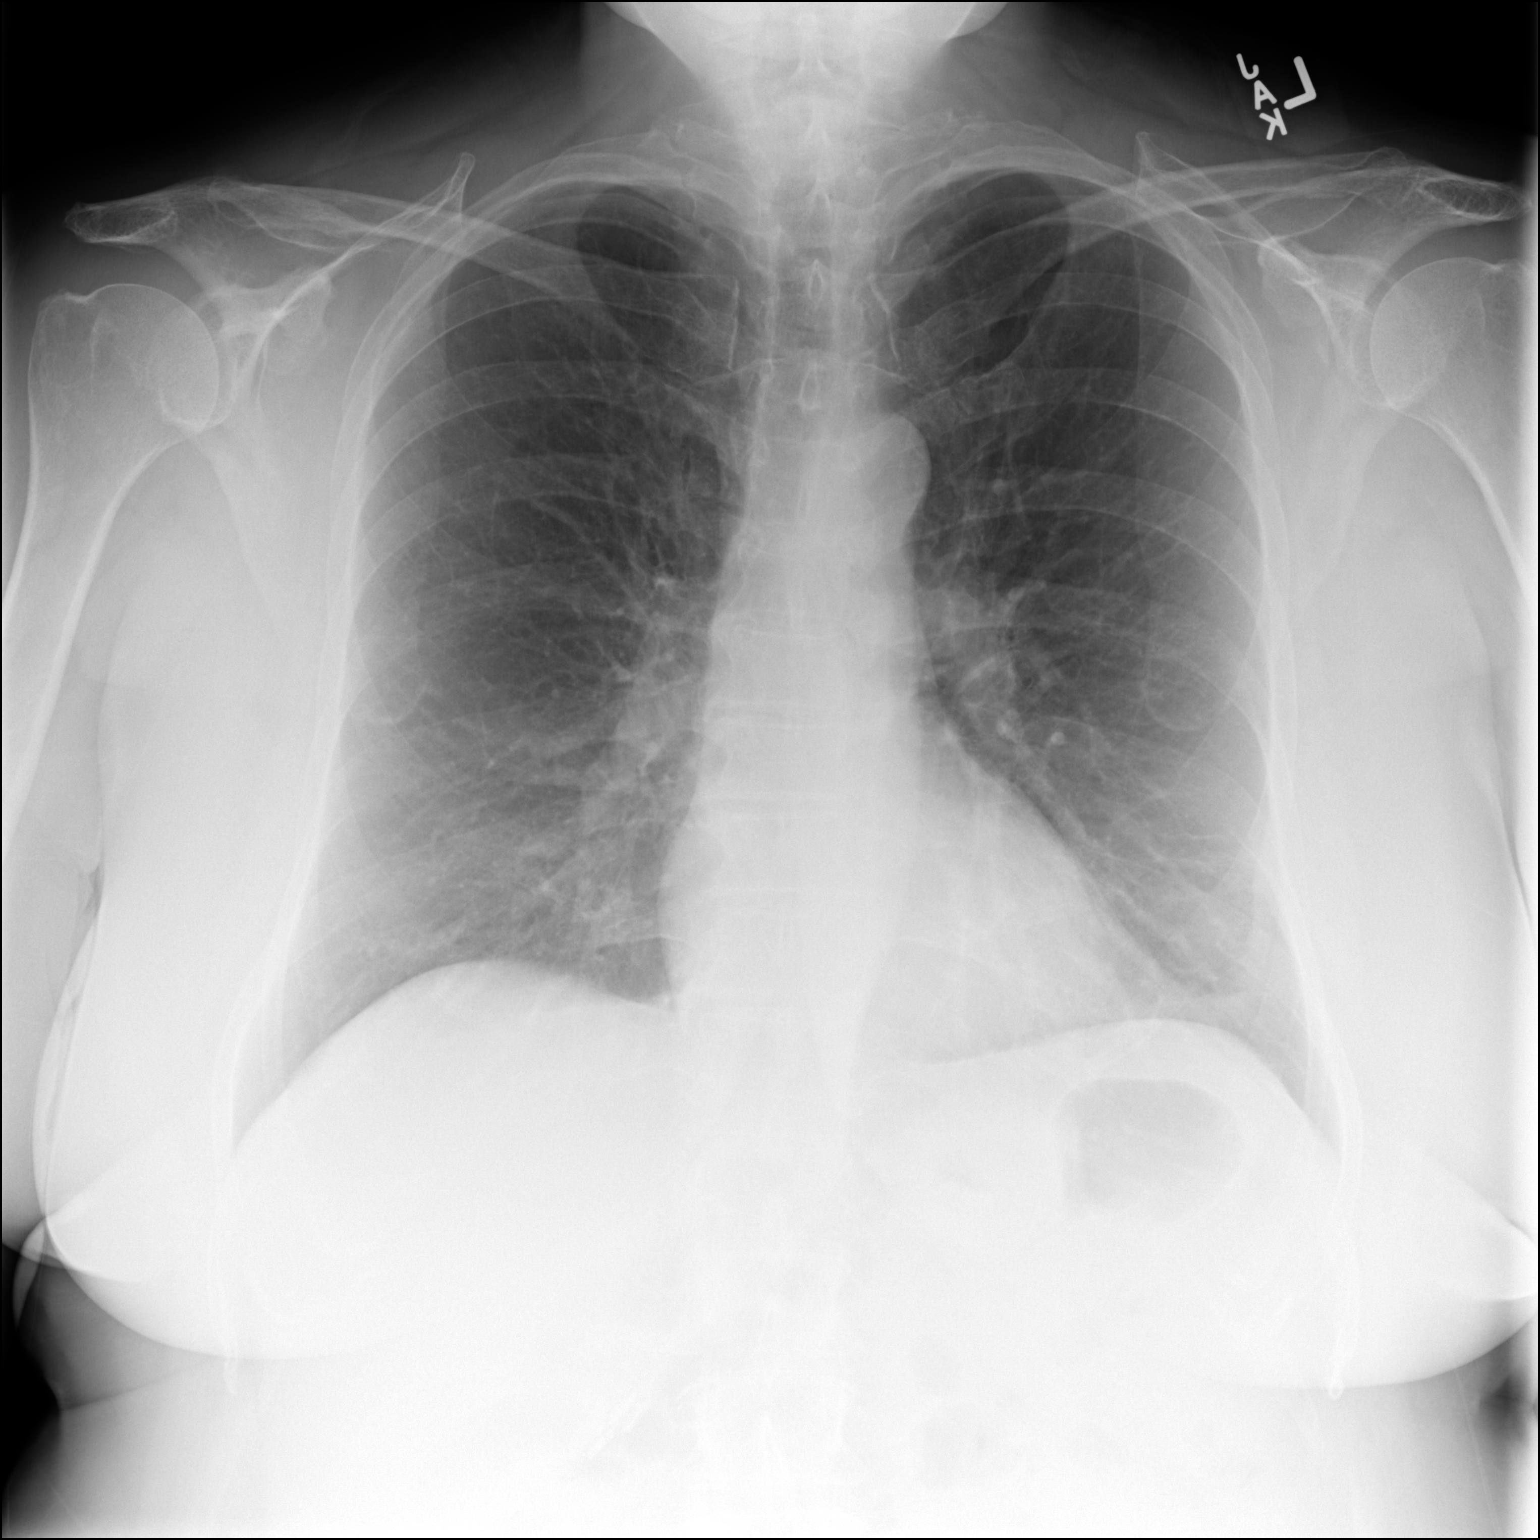

[dg chest 2 view (2 of 2)]
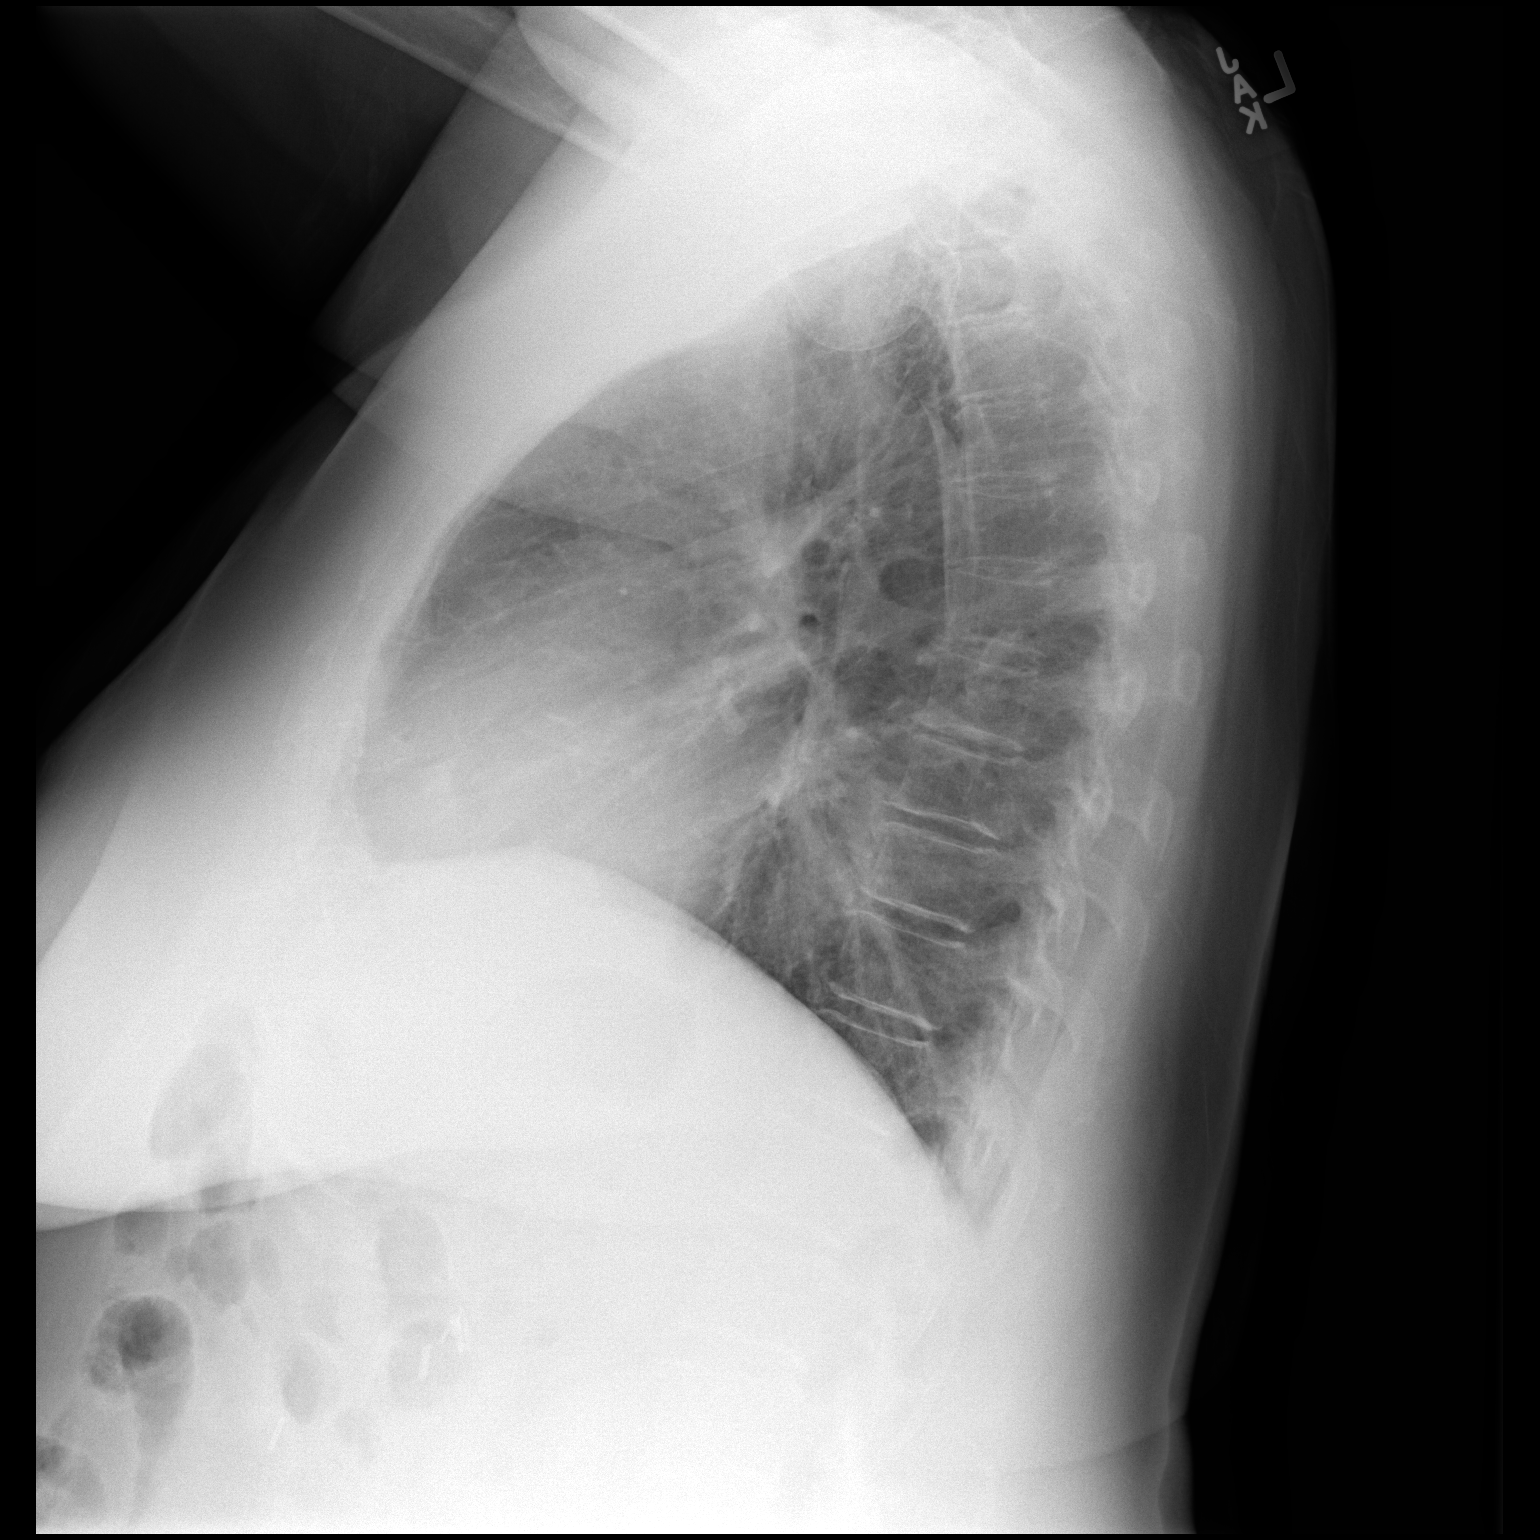

[2 of 2 positions shown; findings below may reference images not displayed]

FINDINGS: There has been interval clearing of the infiltrate in the right
suprahilar region. The lungs are now clear. No effusions. Heart size
and vascularity are normal. No acute bone abnormality. Previous
resection of the distal right clavicle.
IMPRESSION: Clearing of the infiltrate at the superior aspect of the right
hilum. No acute abnormalities.

## 2019-01-22 DIAGNOSIS — Z0000 Encounter for general adult medical examination without abnormal findings: Secondary | ICD-10-CM | POA: Diagnosis not present

## 2019-04-23 ENCOUNTER — Other Ambulatory Visit: Payer: Self-pay

## 2019-04-23 DIAGNOSIS — Z20822 Contact with and (suspected) exposure to covid-19: Secondary | ICD-10-CM

## 2019-04-24 LAB — NOVEL CORONAVIRUS, NAA: SARS-CoV-2, NAA: NOT DETECTED

## 2019-07-16 DIAGNOSIS — Z1159 Encounter for screening for other viral diseases: Secondary | ICD-10-CM | POA: Diagnosis not present

## 2019-07-19 DIAGNOSIS — D123 Benign neoplasm of transverse colon: Secondary | ICD-10-CM | POA: Diagnosis not present

## 2019-07-19 DIAGNOSIS — Z86010 Personal history of colonic polyps: Secondary | ICD-10-CM | POA: Diagnosis not present

## 2019-07-19 DIAGNOSIS — K5730 Diverticulosis of large intestine without perforation or abscess without bleeding: Secondary | ICD-10-CM | POA: Diagnosis not present

## 2019-07-24 DIAGNOSIS — Z803 Family history of malignant neoplasm of breast: Secondary | ICD-10-CM | POA: Diagnosis not present

## 2019-07-24 DIAGNOSIS — Z1231 Encounter for screening mammogram for malignant neoplasm of breast: Secondary | ICD-10-CM | POA: Diagnosis not present

## 2019-07-24 DIAGNOSIS — R7309 Other abnormal glucose: Secondary | ICD-10-CM | POA: Diagnosis not present

## 2019-07-24 DIAGNOSIS — E7800 Pure hypercholesterolemia, unspecified: Secondary | ICD-10-CM | POA: Diagnosis not present

## 2019-07-26 DIAGNOSIS — E7800 Pure hypercholesterolemia, unspecified: Secondary | ICD-10-CM | POA: Diagnosis not present

## 2019-07-26 DIAGNOSIS — R7309 Other abnormal glucose: Secondary | ICD-10-CM | POA: Diagnosis not present

## 2019-10-03 DIAGNOSIS — R05 Cough: Secondary | ICD-10-CM | POA: Diagnosis not present

## 2019-10-05 DIAGNOSIS — Z20822 Contact with and (suspected) exposure to covid-19: Secondary | ICD-10-CM | POA: Diagnosis not present

## 2019-10-05 DIAGNOSIS — J208 Acute bronchitis due to other specified organisms: Secondary | ICD-10-CM | POA: Diagnosis not present

## 2020-01-23 DIAGNOSIS — Z01419 Encounter for gynecological examination (general) (routine) without abnormal findings: Secondary | ICD-10-CM | POA: Diagnosis not present

## 2020-01-23 DIAGNOSIS — Z1151 Encounter for screening for human papillomavirus (HPV): Secondary | ICD-10-CM | POA: Diagnosis not present

## 2020-01-23 DIAGNOSIS — Z6841 Body Mass Index (BMI) 40.0 and over, adult: Secondary | ICD-10-CM | POA: Diagnosis not present

## 2020-06-02 DIAGNOSIS — K635 Polyp of colon: Secondary | ICD-10-CM | POA: Diagnosis not present

## 2020-06-02 DIAGNOSIS — R7303 Prediabetes: Secondary | ICD-10-CM | POA: Diagnosis not present

## 2020-06-02 DIAGNOSIS — Z23 Encounter for immunization: Secondary | ICD-10-CM | POA: Diagnosis not present

## 2020-06-02 DIAGNOSIS — S46211A Strain of muscle, fascia and tendon of other parts of biceps, right arm, initial encounter: Secondary | ICD-10-CM | POA: Diagnosis not present

## 2020-06-02 DIAGNOSIS — Z9049 Acquired absence of other specified parts of digestive tract: Secondary | ICD-10-CM | POA: Diagnosis not present

## 2020-07-04 DIAGNOSIS — M79604 Pain in right leg: Secondary | ICD-10-CM | POA: Diagnosis not present

## 2020-07-29 DIAGNOSIS — Z1231 Encounter for screening mammogram for malignant neoplasm of breast: Secondary | ICD-10-CM | POA: Diagnosis not present

## 2020-07-31 DIAGNOSIS — J0100 Acute maxillary sinusitis, unspecified: Secondary | ICD-10-CM | POA: Diagnosis not present

## 2020-08-28 DIAGNOSIS — R059 Cough, unspecified: Secondary | ICD-10-CM | POA: Diagnosis not present

## 2020-08-29 DIAGNOSIS — R059 Cough, unspecified: Secondary | ICD-10-CM | POA: Diagnosis not present

## 2020-09-03 DIAGNOSIS — H401132 Primary open-angle glaucoma, bilateral, moderate stage: Secondary | ICD-10-CM | POA: Diagnosis not present
